# Patient Record
Sex: Male | Born: 1965 | Hispanic: No | Marital: Married | Smoking: Never smoker
Health system: Southern US, Community
[De-identification: ages and names within clinical notes are randomized; demographics above are authoritative.]

## PROBLEM LIST (undated history)

## (undated) DIAGNOSIS — Z9889 Other specified postprocedural states: Secondary | ICD-10-CM

## (undated) DIAGNOSIS — K219 Gastro-esophageal reflux disease without esophagitis: Secondary | ICD-10-CM

## (undated) DIAGNOSIS — R112 Nausea with vomiting, unspecified: Secondary | ICD-10-CM

## (undated) DIAGNOSIS — G4730 Sleep apnea, unspecified: Secondary | ICD-10-CM

## (undated) HISTORY — DX: Other specified postprocedural states: Z98.890

## (undated) HISTORY — PX: APPENDECTOMY: SHX54

## (undated) HISTORY — DX: Sleep apnea, unspecified: G47.30

## (undated) HISTORY — DX: Gastro-esophageal reflux disease without esophagitis: K21.9

## (undated) HISTORY — DX: Nausea with vomiting, unspecified: R11.2

## (undated) HISTORY — PX: COLONOSCOPY: SHX174

---

## 2005-02-27 ENCOUNTER — Encounter: Admission: RE | Admit: 2005-02-27 | Discharge: 2005-02-27 | Payer: Self-pay | Admitting: Internal Medicine

## 2005-12-20 ENCOUNTER — Emergency Department (HOSPITAL_COMMUNITY): Admission: EM | Admit: 2005-12-20 | Discharge: 2005-12-20 | Payer: Self-pay | Admitting: Family Medicine

## 2008-07-13 ENCOUNTER — Ambulatory Visit: Payer: Self-pay | Admitting: Internal Medicine

## 2008-07-13 DIAGNOSIS — N401 Enlarged prostate with lower urinary tract symptoms: Secondary | ICD-10-CM

## 2008-07-13 LAB — CONVERTED CEMR LAB
Cholesterol: 141 mg/dL (ref 0–200)
HDL: 41.8 mg/dL (ref >39.00)
LDL Cholesterol: 86 mg/dL (ref 0–99)
Total CHOL/HDL Ratio: 3

## 2009-03-06 ENCOUNTER — Ambulatory Visit: Payer: Self-pay | Admitting: Internal Medicine

## 2009-03-06 DIAGNOSIS — N453 Epididymo-orchitis: Secondary | ICD-10-CM | POA: Insufficient documentation

## 2009-03-06 DIAGNOSIS — N508 Other specified disorders of male genital organs: Secondary | ICD-10-CM | POA: Insufficient documentation

## 2009-03-08 ENCOUNTER — Encounter: Admission: RE | Admit: 2009-03-08 | Discharge: 2009-03-08 | Payer: Self-pay | Admitting: Internal Medicine

## 2009-03-08 ENCOUNTER — Encounter: Payer: Self-pay | Admitting: Internal Medicine

## 2009-03-10 ENCOUNTER — Telehealth: Payer: Self-pay | Admitting: Internal Medicine

## 2010-03-06 NOTE — Letter (Signed)
Summary: Results Follow-up Letter  [facility]  [address] [place], [place] [address]   Phone: [phone]  Fax: [phone]    03/08/2009  [address] [place], [place]  [address]  Dear Mr. HOLZHAUER,   The following are the results of your recent test(s):  Test     Result     Ultrasound     small benign fluid collections, essentially normal   _________________________________________________________  Please call for an appointment as needed _________________________________________________________ _________________________________________________________ _________________________________________________________  Sincerely,  Sanda Linger MD [facility]

## 2010-03-06 NOTE — Assessment & Plan Note (Signed)
Summary: MALE PROBLEMS PER WIFE/NWS   Vital Signs:  Patient profile:   45 year old male Height:      154 inches Weight:      135 pounds BMI:     4.02 O2 Sat:      98 % on Room air Temp:     97.9 degrees F oral Pulse rate:   74 / minute Pulse rhythm:   regular Resp:     16 per minute BP sitting:   130 / 80  (left arm) Cuff size:   large  Vitals Entered By: Rock Nephew CMA (March 06, 2009 8:18 AM)  O2 Flow:  Room air  Primary Care Provider:  Etta Grandchild MD   History of Present Illness: He returns c/o one week hx. of pain and swelling in right scrotum. It has slowly eased over the last 2 days.  Preventive Screening-Counseling & Management  Alcohol-Tobacco     Alcohol drinks/day: 0     Smoking Status: never  Medications Prior to Update: 1)  None  Current Medications (verified): 1)  None  Allergies (verified): No Known Drug Allergies  Past History:  Past Medical History: Reviewed history from 07/13/2008 and no changes required. Hemorrhoids  Past Surgical History: Reviewed history from 07/13/2008 and no changes required. Appendectomy  Family History: Reviewed history from 07/13/2008 and no changes required. negative  Social History: Reviewed history from 07/13/2008 and no changes required. Occupation: Nutrition Prof. at [employer] Married Never Smoked Alcohol use-no Drug use-no Regular exercise-yes  Review of Systems  The patient denies anorexia, fever, abdominal pain, genital sores, suspicious skin lesions, and enlarged lymph nodes.   GU:  Denies discharge, dysuria, genital sores, hematuria, incontinence, nocturia, urinary frequency, and urinary hesitancy.  Physical Exam  General:  alert, well-developed, well-nourished, well-hydrated, appropriate dress, normal appearance, healthy-appearing, cooperative to examination, and good hygiene.   Neck:  No deformities, masses, or tenderness noted.normal carotid upstroke and no carotid bruits.   Lungs:   Normal respiratory effort, chest expands symmetrically. Lungs are clear to auscultation, no crackles or wheezes. Heart:  Normal rate and regular rhythm. S1 and S2 normal without gallop, murmur, click, rub or other extra sounds. Abdomen:  soft, non-tender, normal bowel sounds, no distention, no masses, no guarding, no rigidity, no rebound tenderness, no abdominal hernia, no inguinal hernia, no hepatomegaly, no splenomegaly, and abdominal scar(s).   Genitalia:  in the right scrotum there is a hard, 1 cm mass in the epididymitis with some ttp but no fluctuance.  circumcised, no hydrocele, no varicocele, no testicular masses or atrophy, no cutaneous lesions, and no urethral discharge.   Skin:  turgor normal, color normal, no rashes, no suspicious lesions, no ecchymoses, no petechiae, no purpura, and no ulcerations.   Inguinal Nodes:  no R inguinal adenopathy and no L inguinal adenopathy.   Psych:  Cognition and judgment appear intact. Alert and cooperative with normal attention span and concentration. No apparent delusions, illusions, hallucinations   Impression & Recommendations:  Problem # 1:  EPIDIDYMITIS, RIGHT (ICD-604.90) Assessment New start doxycycline, wear supportive underwear, try some nsaids  Problem # 2:  SCROTAL MASS (ICD-608.89) Assessment: New  Orders: Radiology Referral (Radiology)  Complete Medication List: 1)  Doxycycline Hyclate 100 Mg Tabs (Doxycycline hyclate) .... One by mouth  two times a day for 14 days  Patient Instructions: 1)  Please schedule a follow-up appointment in 2 weeks. 2)  Take your antibiotic as prescribed until ALL of it is gone, but stop if  you develop a rash or swelling and contact our office as soon as possible. 3)  Take 400-600mg  of Ibuprofen (Advil, Motrin) with food every 4-6 hours as needed for relief of pain or comfort of fever. Prescriptions: DOXYCYCLINE HYCLATE 100 MG TABS (DOXYCYCLINE HYCLATE) One by mouth  two times a day for 14 days  #28 x  1   Entered and Authorized by:   Etta Grandchild MD   Signed by:   Etta Grandchild MD on 03/06/2009   Method used:   Print then Give to Patient   RxID:   [id number]

## 2010-03-06 NOTE — Progress Notes (Signed)
Summary: u/s results  Phone Note Call from Patient Call back at [facility] Phone [phone]   Summary of Call: Patient called for u/s results. Initial call taken by: Lucious Groves,  March 10, 2009 9:16 AM  Follow-up for Phone Call        left message on machine to call back to office. Follow-up by: Lucious Groves,  March 10, 2009 9:33 AM  Additional Follow-up for Phone Call Additional follow up Details #1::        Patient notified, and notes that he is not bothered much anymore. He will call as needed. Additional Follow-up by: Lucious Groves,  March 10, 2009 10:28 AM

## 2010-09-21 ENCOUNTER — Encounter: Payer: Self-pay | Admitting: Internal Medicine

## 2010-09-21 ENCOUNTER — Ambulatory Visit (INDEPENDENT_AMBULATORY_CARE_PROVIDER_SITE_OTHER): Payer: Self-pay | Admitting: Internal Medicine

## 2010-09-21 VITALS — BP 130/96 | HR 72 | Temp 97.6°F | Resp 16 | Ht 66.0 in | Wt 167.0 lb

## 2010-09-21 DIAGNOSIS — N508 Other specified disorders of male genital organs: Secondary | ICD-10-CM

## 2010-09-21 DIAGNOSIS — N451 Epididymitis: Secondary | ICD-10-CM | POA: Insufficient documentation

## 2010-09-21 DIAGNOSIS — N5089 Other specified disorders of the male genital organs: Secondary | ICD-10-CM | POA: Insufficient documentation

## 2010-09-21 DIAGNOSIS — N453 Epididymo-orchitis: Secondary | ICD-10-CM

## 2010-09-21 MED ORDER — DOXYCYCLINE HYCLATE 100 MG PO TBEC: 100.0000 mg | DELAYED_RELEASE_TABLET | Freq: Two times a day (BID) | ORAL | Status: AC

## 2010-09-21 NOTE — Progress Notes (Signed)
  Subjective:    Patient ID: Wyatt Joseph, male    DOB: [birth year], 45 y.o.   MRN: [id number]  HPI He returns with a complaint that the nodule in his right scrotum has become painful and swollen over the last few weeks. He had an u/s done over a year ago that showed it is a hydrocele - he remains concerned about it.    Review of Systems  Constitutional: Negative.   HENT: Negative.   Eyes: Negative.   Respiratory: Negative.   Cardiovascular: Negative.   Gastrointestinal: Negative.   Genitourinary: Positive for scrotal swelling and testicular pain. Negative for dysuria, urgency, frequency, hematuria, flank pain, decreased urine volume, discharge, penile swelling, enuresis, difficulty urinating, genital sores and penile pain.  Musculoskeletal: Negative.   Skin: Negative for color change, pallor, rash and wound.  Neurological: Negative for dizziness, tremors, seizures, syncope, facial asymmetry, speech difficulty, weakness, light-headedness, numbness and headaches.  Hematological: Negative for adenopathy. Does not bruise/bleed easily.  Psychiatric/Behavioral: Negative.        Objective:   Physical Exam  Vitals reviewed. Constitutional: He is oriented to person, place, and time. He appears well-developed and well-nourished. No distress.  HENT:  Head: Normocephalic and atraumatic.  Right Ear: External ear normal.  Left Ear: External ear normal.  Nose: Nose normal.  Mouth/Throat: Oropharynx is clear and moist. No oropharyngeal exudate.  Eyes: Conjunctivae and EOM are normal. Pupils are equal, round, and reactive to light. Right eye exhibits no discharge. Left eye exhibits no discharge. No scleral icterus.  Neck: Normal range of motion. Neck supple. No JVD present. No tracheal deviation present. No thyromegaly present.  Cardiovascular: Normal rate, regular rhythm, normal heart sounds and intact distal pulses.  Exam reveals no gallop and no friction rub.   No murmur  heard. Pulmonary/Chest: Effort normal and breath sounds normal. No stridor. No respiratory distress. He has no wheezes. He has no rales. He exhibits no tenderness.  Abdominal: Soft. Bowel sounds are normal. He exhibits no distension and no mass. There is no tenderness. There is no rebound and no guarding. Hernia confirmed negative in the right inguinal area and confirmed negative in the left inguinal area.  Genitourinary: Penis normal. Right testis shows mass (there is firm, discreet nodlue under the right testicle). Right testis shows no swelling and no tenderness. Right testis is descended. Cremasteric reflex is not absent on the right side. Left testis shows no mass, no swelling and no tenderness. Left testis is descended. Cremasteric reflex is not absent on the left side. Circumcised. No penile erythema or penile tenderness. No discharge found.  Musculoskeletal: Normal range of motion. He exhibits no edema and no tenderness.  Lymphadenopathy:    He has no cervical adenopathy.       Right: No inguinal adenopathy present.       Left: No inguinal adenopathy present.  Neurological: He is alert and oriented to person, place, and time. He has normal reflexes. He displays normal reflexes. No cranial nerve deficit. He exhibits normal muscle tone. Coordination normal.  Skin: Skin is warm and dry. No rash noted. He is not diaphoretic. No erythema. No pallor.  Psychiatric: He has a normal mood and affect. His behavior is normal. Judgment and thought content normal.          Assessment & Plan:

## 2010-09-21 NOTE — Assessment & Plan Note (Signed)
I have told him that this is a benign cyst but he remains concerned so I have asked him to see a urologist for a second opinion

## 2010-09-21 NOTE — Assessment & Plan Note (Signed)
Try a course of doxy to see if that helps

## 2010-09-21 NOTE — Patient Instructions (Signed)
Epididymitis Epididymitis is a swelling (inflammation) of the epididymis. The epididymis is a cord-like structure along the back part of the testicle. Epididymitis is usually, but not always, caused by infection. This is usually a sudden problem beginning with chills, fever and pain behind the scrotum and in the testicle. There may be swelling and redness of the testicle. DIAGNOSIS Physical examination will reveal a tender, swollen epididymis. Sometimes, cultures are obtained from the urine or from prostate secretions to help find out if there is an infection or if the cause is a different problem. Sometimes, blood work is performed to see if your white blood cell count is elevated and if a germ (bacterial) or viral infection is present. Using this knowledge, an appropriate medication which kills germs (antibiotic) can be chosen by your caregiver. A viral infection causing epididymitis will most often go away (resolve) without treatment. HOME CARE INSTRUCTIONS  Hot sitz baths for 20 minutes, 4 times per day, may help relieve pain.   Only take over-the-counter or prescription medicines for pain, discomfort or fever as directed by your caregiver.   Take all medications, including antibiotics, as directed. Take the antibiotics for the full prescribed length of time even if you are feeling better.   It is very important to keep all follow-up appointments.  SEEK IMMEDIATE MEDICAL CARE IF:  An oral temperature above 100.5 develops, not controlled by medication.   You have pain not relieved with medications.   You have any worsening of your problems (symptoms).   The pain seems to come and go.   You develop pain, redness and swelling in the scrotum and surrounding areas.  MAKE SURE YOU:  Understand these instructions.   Will watch your condition.   Will get help right away if you are not doing well or get worse.  Document Released: 01/19/2000 Document Re-Released: 04/17/2009 [facility]  Patient Information 2011 [place], [place].

## 2017-06-02 ENCOUNTER — Ambulatory Visit: Payer: BC Managed Care – PPO | Admitting: Family Medicine

## 2017-06-02 ENCOUNTER — Encounter: Payer: Self-pay | Admitting: Family Medicine

## 2017-06-02 VITALS — BP 112/78 | HR 59 | Temp 98.0°F | Resp 12 | Ht 67.0 in | Wt 165.0 lb

## 2017-06-02 DIAGNOSIS — R001 Bradycardia, unspecified: Secondary | ICD-10-CM

## 2017-06-02 DIAGNOSIS — G4730 Sleep apnea, unspecified: Secondary | ICD-10-CM | POA: Diagnosis not present

## 2017-06-02 DIAGNOSIS — Z0001 Encounter for general adult medical examination with abnormal findings: Secondary | ICD-10-CM

## 2017-06-02 DIAGNOSIS — M25511 Pain in right shoulder: Secondary | ICD-10-CM | POA: Diagnosis not present

## 2017-06-02 DIAGNOSIS — G8929 Other chronic pain: Secondary | ICD-10-CM | POA: Diagnosis not present

## 2017-06-02 DIAGNOSIS — Z13220 Encounter for screening for lipoid disorders: Secondary | ICD-10-CM | POA: Diagnosis not present

## 2017-06-02 LAB — COMPREHENSIVE METABOLIC PANEL
ALT: 15 U/L (ref 0–53)
AST: 18 U/L (ref 0–37)
Albumin: 4.6 g/dL (ref 3.5–5.2)
Alkaline Phosphatase: 82 U/L (ref 39–117)
BUN: 11 mg/dL (ref 6–23)
CO2: 30 meq/L (ref 19–32)
Calcium: 9.7 mg/dL (ref 8.4–10.5)
Chloride: 103 mEq/L (ref 96–112)
Creatinine, Ser: 0.84 mg/dL (ref 0.40–1.50)
GFR: 101.65 mL/min (ref >60.00)
GLUCOSE: 89 mg/dL (ref 70–99)
POTASSIUM: 4.2 meq/L (ref 3.5–5.1)
Sodium: 140 mEq/L (ref 135–145)
Total Bilirubin: 1.5 mg/dL — ABNORMAL HIGH (ref 0.2–1.2)
Total Protein: 7 g/dL (ref 6.0–8.3)

## 2017-06-02 LAB — CBC
HEMATOCRIT: 50.5 % (ref 39.0–52.0)
HEMOGLOBIN: 17.1 g/dL — AB (ref 13.0–17.0)
MCHC: 33.9 g/dL (ref 30.0–36.0)
MCV: 87.3 fl (ref 78.0–100.0)
PLATELETS: 204 10*3/uL (ref 150.0–400.0)
RBC: 5.78 Mil/uL (ref 4.22–5.81)
RDW: 12.1 % (ref 11.5–15.5)
WBC: 3.6 10*3/uL — AB (ref 4.0–10.5)

## 2017-06-02 LAB — LIPID PANEL
CHOL/HDL RATIO: 3
Cholesterol: 146 mg/dL (ref 0–200)
HDL: 47.4 mg/dL (ref >39.00)
LDL CALC: 84 mg/dL (ref 0–99)
NONHDL: 98.81
Triglycerides: 73 mg/dL (ref 0.0–149.0)
VLDL: 14.6 mg/dL (ref 0.0–40.0)

## 2017-06-02 LAB — TSH: TSH: 0.77 u[IU]/mL (ref 0.35–4.50)

## 2017-06-02 NOTE — Progress Notes (Signed)
Subjective:  Wyatt Joseph is a 52 y.o. male who presents today for his annual comprehensive physical exam and to establish care.   HPI:  He has no acute complaints today.   He has 2 stable, chronic conditions outlined below: 1.  Right shoulder pain.  Several year history.  No obvious precipitating events.  No treatments tried.  Symptoms seem to be worse at night. 2.  Sleep apnea symptoms.  Had a sleep study done in the past which showed several apneic episodes.  Interested in having sleep study done again.  Lifestyle Diet: Tries to eat a healthy and balanced diet.  Exercise: Walks regularly. Occasionally goes to the gym.   Depression screen PHQ 2/9 06/02/2017  Decreased Interest 0  Down, Depressed, Hopeless 0  PHQ - 2 Score 0   Health Maintenance Due  Topic Date Due  . HIV Screening  07/21/1979  . TETANUS/TDAP  02/04/2014  . COLONOSCOPY  07/21/2014    ROS: Per HPI, otherwise a complete review of systems was negative.   PMH:  The following were reviewed and entered/updated in epic: Past Medical History:  Diagnosis Date  . Hemorrhoids    Patient Active Problem List   Diagnosis Date Noted  . Scrotal mass 09/21/2010  . Epididymitis, right 09/21/2010  . BENIGN PROSTATIC HYPERTROPHY, WITH URINARY OBSTRUCTION 07/13/2008   Past Surgical History:  Procedure Laterality Date  . APPENDECTOMY      Family History  Problem Relation Age of Onset  . Cancer Neg Hx     Medications- reviewed and updated No current outpatient medications on file.   No current facility-administered medications for this visit.     Allergies-reviewed and updated No Known Allergies  Social History   Socioeconomic History  . Marital status: Married    Spouse name: Not on file  . Number of children: 2  . Years of education: Not on file  . Highest education level: Not on file  Occupational History  . Not on file  Social Needs  . Financial resource strain: Not on file  . Food  insecurity:    Worry: Not on file    Inability: Not on file  . Transportation needs:    Medical: Not on file    Non-medical: Not on file  Tobacco Use  . Smoking status: Never Smoker  . Smokeless tobacco: Never Used  . Tobacco comment: Regular Exercise - Yes  Substance and Sexual Activity  . Alcohol use: No  . Drug use: No  . Sexual activity: Yes    Birth control/protection: Condom  Lifestyle  . Physical activity:    Days per week: Not on file    Minutes per session: Not on file  . Stress: Not on file  Relationships  . Social connections:    Talks on phone: Not on file    Gets together: Not on file    Attends religious service: Not on file    Active member of club or organization: Not on file    Attends meetings of clubs or organizations: Not on file    Relationship status: Not on file  Other Topics Concern  . Not on file  Social History Narrative  . Not on file    Objective:  Physical Exam: BP 112/78 (BP Location: Left Arm)   Pulse (!) 59   Temp 98 F (36.7 C) (Oral)   Resp 12   Ht  (1.702 m)   Wt 165 lb (74.8 kg)   SpO2 99%  BMI 25.84 kg/m   Body mass index is 25.84 kg/m. Wt Readings from Last 3 Encounters:  06/02/17 165 lb (74.8 kg)  09/21/10 167 lb (75.8 kg)   Gen: NAD, resting comfortably HEENT: TMs normal bilaterally. OP clear. No thyromegaly noted.  CV: RRR with no murmurs appreciated Pulm: NWOB, CTAB with no crackles, wheezes, or rhonchi GI: Normal bowel sounds present. Soft, Nontender, Nondistended. MSK:  -Right neck: No deformities.  Spurling negative. -Right shoulder: No deformities.  Strength 5 out of 5 throughout.  No pain with internal or external rotation.  Hawking and Neer test positive. Skin: warm, dry Neuro: CN2-12 grossly intact. Strength 5/5 in upper and lower extremities. Reflexes symmetric and intact bilaterally.  Psych: Normal affect and thought content  Assessment/Plan:  Right shoulder pain Exam consistent with rotator  cuff impingement.  Discussed conservative management including home exercise program.  Patient not interested in oral medications at this point.  Sleep disordered breathing Referral to sleep medicine placed.  Bradycardia Asymptomatic.  Check CBC, TSH, and CMET.  Preventative Healthcare: Check lipid panel.  Obtain records from previous PCP regarding colon cancer screening.  Patient Counseling(The following topics were reviewed and/or handout was given):  -Nutrition: Stressed importance of moderation in sodium/caffeine intake, saturated fat and cholesterol, caloric balance, sufficient intake of fresh fruits, vegetables, and fiber.  -Stressed the importance of regular exercise.   -Substance Abuse: Discussed cessation/primary prevention of tobacco, alcohol, or other drug use; driving or other dangerous activities under the influence; availability of treatment for abuse.   -Injury prevention: Discussed safety belts, safety helmets, smoke detector, smoking near bedding or upholstery.   -Sexuality: Discussed sexually transmitted diseases, partner selection, use of condoms, avoidance of unintended pregnancy and contraceptive alternatives.   -Dental health: Discussed importance of regular tooth brushing, flossing, and dental visits.  -Health maintenance and immunizations reviewed. Please refer to Health maintenance section.  Return to care in 1 year for next preventative visit.   Katina Degree. [name], MD 06/02/2017 8:36 AM

## 2017-06-02 NOTE — Patient Instructions (Signed)

## 2017-06-04 ENCOUNTER — Other Ambulatory Visit: Payer: Self-pay

## 2017-06-04 DIAGNOSIS — R17 Unspecified jaundice: Secondary | ICD-10-CM

## 2017-06-05 ENCOUNTER — Other Ambulatory Visit (INDEPENDENT_AMBULATORY_CARE_PROVIDER_SITE_OTHER): Payer: BC Managed Care – PPO

## 2017-06-05 DIAGNOSIS — R17 Unspecified jaundice: Secondary | ICD-10-CM | POA: Diagnosis not present

## 2017-06-06 LAB — BILIRUBIN, FRACTIONATED(TOT/DIR/INDIR)
Bilirubin, Direct: 0.3 mg/dL — ABNORMAL HIGH (ref 0.0–0.2)
Indirect Bilirubin: 0.8 mg/dL (calc) (ref 0.2–1.2)
Total Bilirubin: 1.1 mg/dL (ref 0.2–1.2)

## 2017-07-21 ENCOUNTER — Ambulatory Visit: Payer: BC Managed Care – PPO | Admitting: Neurology

## 2017-07-21 ENCOUNTER — Encounter: Payer: Self-pay | Admitting: Neurology

## 2017-07-21 VITALS — BP 124/86 | HR 56 | Ht 66.0 in | Wt 166.0 lb

## 2017-07-21 DIAGNOSIS — G4733 Obstructive sleep apnea (adult) (pediatric): Secondary | ICD-10-CM | POA: Diagnosis not present

## 2017-07-21 DIAGNOSIS — R0683 Snoring: Secondary | ICD-10-CM

## 2017-07-21 NOTE — Progress Notes (Signed)
SLEEP MEDICINE CLINIC   Provider:  Melvyn Novasarmen  Kenitra Leventhal, [place]  Primary Care Physician:  Ardith DarkParker, Caleb M, MD   Referring Provider: Ardith DarkParker, Caleb M, MD    Chief Complaint  Patient presents with  . New Patient (Initial Visit)    pt alone, rm 11. pt states that he snores in sleep. pt states that he gets 6-7 hours each night of sleep and then sometimes he feels that he isnt well rested. states that he wakes up with dry mouth. pt [place], unable to get study, stated he was mild to moderate sleep apnea. he tried a dental device.     HPI:  Wyatt MootsMohamed Joseph is a 52 y.o. male , seen here as a NP referral from Dr. Jimmey RalphParker for a sleep apnea evaluation.   Wyatt Joseph wakes up with a dry mouth, snores ( wife reported this ) and he has been not resting well. He suspects he has apnea. He rests well, but is not refreshed as he used to be.   2 years ago by the patient lived in [place] he underwent a home sleep test through a physician, he recalls that he was diagnosed with mild to moderate apnea, he did not want to go to a CPAP route and instead chose a dental device.  He said that the dental device was made for him and is a device that is potentially adjustable, however it led to toothache as his teeth began to shift under the dental therapy.  He would now allow for a CPAP, if it is a  travel friendly device.      Chief complaint according to patient :  Sleep habits are as follows: he allocate at least 6 hours.  Bedtime is 11 - midnight, rise time is 6 AM, on weekends a little later. No electronics in the bedroom.  He works on the computer before he retreats to the bedroom.  Wyatt Joseph describes his bedroom is cool, quiet and dark.  He shares a bedroom with his wife who has witnessed him to snore, but she has not reported him to stop in his breathing. He has sometimes woken up from his snoring. He sleeps mostly on the side, but he finds himself turning to his back.  He  sleeps on 2 pillows, one for the shoulder and one for head and neck.  He has right shoulder pain.  He rarely has nocturia and dreams some night.   Sleep medical history and family sleep history:  No sleep walking history , snoring started 2 years ago , father had apnea and snoring.    Social history:   Married , from [place]. Non smoker, non ETOH, caffeine : tea, 2 a day, Sodas not daily. Coffee less frequent.  He travels for business, sometimes against time zone. Not a shift worker.     Review of Systems: Out of a complete 14 system review, the patient complains of only the following symptoms, and all other reviewed systems are negative.   Epworth score 5 , Fatigue severity score 27 , depression score N/a    Social History   Socioeconomic History  . Marital status: Married    Spouse name: Not on file  . Number of children: 2  . Years of education: Not on file  . Highest education level: Not on file  Occupational History  . Not on file  Social Needs  . Financial resource strain: Not on file  .  Food insecurity:    Worry: Not on file    Inability: Not on file  . Transportation needs:    Medical: Not on file    Non-medical: Not on file  Tobacco Use  . Smoking status: Never Smoker  . Smokeless tobacco: Never Used  . Tobacco comment: Regular Exercise - Yes  Substance and Sexual Activity  . Alcohol use: No  . Drug use: No  . Sexual activity: Yes    Birth control/protection: Condom  Lifestyle  . Physical activity:    Days per week: Not on file    Minutes per session: Not on file  . Stress: Not on file  Relationships  . Social connections:    Talks on phone: Not on file    Gets together: Not on file    Attends religious service: Not on file    Active member of club or organization: Not on file    Attends meetings of clubs or organizations: Not on file    Relationship status: Not on file  . Intimate partner violence:    Fear of current or ex partner: Not on file     Emotionally abused: Not on file    Physically abused: Not on file    Forced sexual activity: Not on file  Other Topics Concern  . Not on file  Social History Narrative  . Not on file    Family History  Problem Relation Age of Onset  . Cancer Neg Hx     Past Medical History:  Diagnosis Date  . Hemorrhoids     Past Surgical History:  Procedure Laterality Date  . APPENDECTOMY      No current outpatient medications on file.   No current facility-administered medications for this visit.     Allergies as of 07/21/2017  . (No Known Allergies)    Vitals: BP 124/86   Pulse (!) 56   Ht 5\' 6"  (1.676 m)   Wt 166 lb (75.3 kg)   BMI 26.79 kg/m  Last Weight:  Wt Readings from Last 1 Encounters:  07/21/17 166 lb (75.3 kg)   QIO:NGEX mass index is 26.79 kg/m.     Last Height:   Ht Readings from Last 1 Encounters:  07/21/17 5\' 6"  (1.676 m)    Physical exam:  General: The patient is awake, alert and appears not in acute distress. The patient is well groomed. Head: Normocephalic, atraumatic. Neck is supple. Mallampati 3 - lateral narrow, but not tight. , tonsils in place.  neck circumference: 41 cm , 15.75 " Nasal airflow patent ,  Retrognathia is mild .  Cardiovascular:  Regular rate and rhythm, without  murmurs or carotid bruit, and without distended neck veins. Respiratory: Lungs are clear to auscultation. Skin:  Without evidence of edema, or rash Trunk: BMI is 26.7 . The patient's posture is erect.  Neurologic exam : The patient is awake and alert, oriented to place and time.   Attention span & concentration ability appears normal.  Speech is fluent,  without dysarthria, dysphonia or aphasia.  Mood and affect are appropriate.  Cranial nerves: Pupils are equal and briskly reactive to light.  Funduscopic exam without evidence of pallor or edema. Extraocular movements  in vertical and horizontal planes intact and without nystagmus. Visual fields by finger perimetry are  intact. Hearing to finger rub intact.   Facial sensation intact to fine touch.  Facial motor strength is symmetric and tongue and uvula move midline. Shoulder shrug was symmetrical.   Motor  exam:  Normal tone, muscle bulk and symmetric strength in all extremities. Sensory:  Fine touch, pinprick and vibration were tested in all extremities.  Coordination: Rapid alternating movements in the fingers/hands was normal. Finger-to-nose maneuver  normal without evidence of ataxia, dysmetria or tremor. Gait and station: Patient walks without assistive device and is able unassisted to climb up to the exam table.   Deep tendon reflexes: in the  upper and lower extremities are symmetric and intact.    Assessment:  After physical and neurologic examination, review of laboratory studies,  Personal review of imaging studies, reports of other /same  Imaging studies, results of polysomnography and / or neurophysiology testing and pre-existing records as far as provided in visit., my assessment is   1)  Likely snoring with mild OSA, I think a HST is sufficient.   2)  Not suffering from  hypersomnia, but yawning frequently.    3) fatigue 27 points-  some times.    The patient was advised of the nature of the diagnosed disorder , the treatment options and the  risks for general health and wellness arising from not treating the condition.  I will order a HST for apnea screening.    I spent more than 40 minutes of face to face time with the patient.  Greater than 50% of time was spent in counseling and coordination of care. We have discussed the diagnosis and differential and I answered the patient's questions.    Plan:  Treatment plan and additional workup :  HST -  Screening  Test.   Rv after test. He would now allow for a CPAP, if it is a  travel friendly device.    Melvyn Novas, MD 07/21/2017, 12:01 PM  Certified in Neurology by ABPN Certified in Sleep Medicine by [facility], [facility] [address], [address] [place], [place] [address]

## 2017-07-21 NOTE — Patient Instructions (Signed)
Apnea treatment options include CPAP -BiPAP and VPAP,  dental device, and INSPIRE procedure.

## 2017-08-04 ENCOUNTER — Encounter: Payer: Self-pay | Admitting: Sports Medicine

## 2017-08-04 ENCOUNTER — Ambulatory Visit
Admission: RE | Admit: 2017-08-04 | Discharge: 2017-08-04 | Disposition: A | Discharge status: Home / Self Care | Payer: BC Managed Care – PPO | Source: Ambulatory Visit | Attending: Sports Medicine | Admitting: Sports Medicine

## 2017-08-04 ENCOUNTER — Ambulatory Visit: Payer: BC Managed Care – PPO | Admitting: Sports Medicine

## 2017-08-04 VITALS — BP 110/70 | Ht 66.0 in | Wt 160.0 lb

## 2017-08-04 DIAGNOSIS — M25561 Pain in right knee: Secondary | ICD-10-CM

## 2017-08-04 DIAGNOSIS — G8929 Other chronic pain: Secondary | ICD-10-CM

## 2017-08-04 DIAGNOSIS — M25511 Pain in right shoulder: Secondary | ICD-10-CM | POA: Diagnosis not present

## 2017-08-04 DIAGNOSIS — M25562 Pain in left knee: Secondary | ICD-10-CM | POA: Diagnosis not present

## 2017-08-04 NOTE — Progress Notes (Signed)
   Subjective:    Patient ID: Wyatt Joseph, male    DOB: 21-Jul-1965, 52 y.o.   MRN: [id number]  HPI Patient is a 52 year old male who presents today complaining of right shoulder pain.  Patient reports that pain has been going on for the past year and a half.  Reports pain is worse in the morning after waking up.  He describes the pain as sharp inside the shoulder joint.  He is not bothered by it during the day except when he does specific movements.  Patient has not tried any medications.  Patient also denies any trauma or prior injury to his right shoulder.  Patient was seen by PCP and was diagnosed with rotator cuff impingement.  Patient was given exercises to do at home but he reports nonadherence to regimen.  Patient is slightly concerned about tenderness in his right shoulder since it appears to be chronic in nature.  Patient states he [facility] at [employer].  Review of Systems  Constitutional: Negative.   HENT: Negative.   Eyes: Negative.   Respiratory: Negative.   Cardiovascular: Negative.   Gastrointestinal: Negative.   Endocrine: Negative.   Genitourinary: Negative.   Musculoskeletal:       Right shoulder pain/tenderness  Skin: Negative.   Neurological: Negative.   Hematological: Negative.   Psychiatric/Behavioral: Negative.        Objective:   Physical Exam Right shoulder: Inspection reveals no abnormalities, atrophy or asymmetry.  Palpation is normal with no tenderness over AC joint or bicipital groove.  ROM is full in all planes.  Rotator cuff strength normal throughout.  Positive Neer and Hawkin's tests, empty can test within normal limit.  Speeds and Yergason's tests normal.  Passive external rotation with pain noted around 60 degree No labral pathology noted with negative Obrien's, negative clunk and good stability.  Normal scapular function observed.  Painful arc and no drop arm sign.  No apprehension sign      Assessment & Plan:   #Right shoulder  pain, chronic, worsening Patient presents with right shoulder pain for over a year. Diagnosed with right rotator cuff impingement by PCP and given home exercise that patient has not done. On exam here today patient had positive Neer and Hawkins test consistent with cuff impingement. However given chronicity and patient age would also like to rule out possible arthritis. No trauma or prior injury reported. Patient is reticent to the idea of using NSAIDs for pain and would like to try conservative management.  --Order axillary view of right shoulder --Schedule right shoulder U/S for 7/8 @10 :15 --Will discuss plan and approach at next office visit after imaging results.  Patient seen and evaluated with the resident. I agree with the above plan of care. X-rays show no significant degenerative changes. Patient will follow-up for right shoulder ultrasound on July 8. We will delineate further treatment based on those findings. Of note, his symptoms seem to be most noticeable first thing in the morning and really do not limit his activities during the day.

## 2017-08-04 NOTE — Addendum Note (Signed)
Addended by: Reino BellisRAPER, TIMOTHY R on: 08/04/2017 06:01 PM   Modules accepted: Level of Service

## 2017-08-11 ENCOUNTER — Ambulatory Visit (INDEPENDENT_AMBULATORY_CARE_PROVIDER_SITE_OTHER): Payer: BC Managed Care – PPO | Admitting: Sports Medicine

## 2017-08-11 VITALS — BP 118/82 | Ht 66.0 in | Wt 160.0 lb

## 2017-08-11 DIAGNOSIS — G8929 Other chronic pain: Secondary | ICD-10-CM | POA: Diagnosis not present

## 2017-08-11 DIAGNOSIS — M25511 Pain in right shoulder: Secondary | ICD-10-CM | POA: Diagnosis not present

## 2017-08-11 NOTE — Progress Notes (Signed)
  Patient comes in today for follow-up. He is here today for a shoulder ultrasound. X-rays of his right shoulder were unremarkable. Please see the office visit note from last week for details regarding history and physical exam findings.  MSK ultrasound of the right shoulder was performed. Limited images were obtained. Biceps tendon was well visualized within the bicipital groove and was unremarkable. Acromioclavicular joint also unremarkable. Subscapularis, supraspinatus, and infraspinatus tendons all well-visualized. There are some small calcifications within the midportion of the supraspinatus tendon but no discrete tear is seen. Slight fluid in the subacromial bursa. 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 Tentative follow-up for 6 weeks from now which the patient may cancel if he's feeling better. If symptoms persist or worsen, we could consider merits of a subacromial cortisone injection or further diagnostic imaging.  Total time spent with the patient was 15 minutes with greater than 50% of the time spent in face-to-face consultation performing the ultrasound and discussing treatment.

## 2017-08-14 ENCOUNTER — Ambulatory Visit: Payer: BC Managed Care – PPO | Admitting: Neurology

## 2017-08-14 DIAGNOSIS — G4733 Obstructive sleep apnea (adult) (pediatric): Secondary | ICD-10-CM | POA: Diagnosis not present

## 2017-08-14 DIAGNOSIS — R0683 Snoring: Secondary | ICD-10-CM

## 2017-08-28 ENCOUNTER — Telehealth: Payer: Self-pay | Admitting: Neurology

## 2017-08-28 NOTE — Telephone Encounter (Signed)
I will let Dr Vickey Hugerohmeier know that he is asking about his results and will call the pt as soon as I have these results.

## 2017-08-28 NOTE — Telephone Encounter (Signed)
Pt is asking for a call with results of sleep study when they are available

## 2017-08-28 NOTE — Procedures (Signed)
[facility] [address] [place], [place] [address] NAME:    [name]                                                  DOB: 04/15/1965 MEDICAL RECORD:  [id number]                                              DOS:  08/14/2017 REFERRING PHYSICIAN: Caleb Parker, M.D. STUDY PERFORMED: Home Sleep Study by watch pat.  HISTORY:  Wyatt Joseph is a 52 y.o. male patient, who wakes up with a dry mouth, snores ( wife reported this ), and he has been not resting well. He suspects he has apnea. He is not refreshed after sleeping as he used to be.  2 years ago (while the patient lived in [place]) he underwent a home sleep test through a local physician, he was diagnosed with mild to moderate apnea. He did not want to go to a CPAP route and instead chose a dental device.  He said that the dental device was made for him and is a device that's potentially adjustable, however it led to toothache as his teeth began to shift under the dental therapy. He would now allow for a CPAP, if it is a travel friendly device.  Epworth score 5, Fatigue severity score 27, BMI: 26.6   STUDY RESULTS:  Total Recording Time:  7 hours 12 minutes, by watch pat.  Total Apnea/Hypopnea Index (AHI): 12.5 /h, RDI: 18.2 /h Average Oxygen Saturation:   95 %; Lowest Oxygen Desaturation: 89 %  Total Time Oxygen Saturation Below or at 88 %:  0.0 minutes  Average Heart Rate:  48 bpm (between 40 and 80 bpm) IMPRESSION: Mild OSA with AHI 12.5/h, but moderate- severe snoring reflected in much higher RDI.  No associated hypoxemia, cardiac irregularity or other sign of physiological stress.  RECOMMENDATION: CPAP auto titration 5-12 cm water, mask of patient's choice to be fitted by DME.        I certify that I have reviewed the raw data recording prior to the issuance of this report in accordance with the standards of the American Academy of Sleep Medicine (AASM). Nazair Fortenberry, M.D.      Medical Director of [facility], accredited by the AASM. Diplomat of the ABPN and ABSM.       

## 2017-08-28 NOTE — Telephone Encounter (Signed)
[facility] [address] [place], [place] [address] NAME:    Trenda Mootshmedna Shermar                                                  DOB: 1965-07-25 MEDICAL RECORD NUMBER [id number]                                              DOS:  08/14/2017 REFERRING PHYSICIAN: Jacquiline Doealeb Parker, M.D. STUDY PERFORMED: Home Sleep Study by watch pat.  HISTORY:  Trenda MootsMohamed Yawn is a 52 y.o. male patient, who wakes up with a dry mouth, snores ( wife reported this ), and he has been not resting well. He suspects he has apnea. He is not refreshed after sleeping as he used to be.  2 years ago (while the patient lived in [place]) he underwent a home sleep test through a local physician, he was diagnosed with mild to moderate apnea. He did not want to go to a CPAP route and instead chose a dental device.  He said that the dental device was made for him and is a device that's potentially adjustable, however it led to toothache as his teeth began to shift under the dental therapy. He would now allow for a CPAP, if it is a travel friendly device.  Epworth score 5, Fatigue severity score 27, BMI: 26.6   STUDY RESULTS:  Total Recording Time:  7 hours 12 minutes, by watch pat.  Total Apnea/Hypopnea Index (AHI): 12.5 /h, RDI: 18.2 /h Average Oxygen Saturation:   95 %; Lowest Oxygen Desaturation: 89 %  Total Time Oxygen Saturation Below or at 88 %:  0.0 minutes  Average Heart Rate:  48 bpm (between 40 and 80 bpm) IMPRESSION: Mild OSA with AHI 12.5/h, but moderate- severe snoring reflected in much higher RDI.  No associated hypoxemia, cardiac irregularity or other sign of physiological stress.  RECOMMENDATION: CPAP auto titration 5-12 cm water, mask of patient's choice to be fitted by DME.        I certify that I have reviewed the raw data recording prior to the issuance of this report in accordance with the standards of the American Academy of Sleep Medicine (AASM). Melvyn Novasarmen Olamide Lahaie, M.D.      Medical Director of [facility], accredited by the AASM. Diplomat of the ABPN and ABSM.

## 2017-08-28 NOTE — Addendum Note (Signed)
Addended by: Melvyn NovasHMEIER, Jahzaria Vary on: 08/28/2017 04:50 PM   Modules accepted: Orders

## 2017-08-29 NOTE — Telephone Encounter (Signed)
Called patient to discuss sleep study results. No answer at this time. LVM for the patient to call back.   

## 2017-08-29 NOTE — Telephone Encounter (Signed)
Called the patient back. No answer. LVM once more to review.

## 2017-08-29 NOTE — Telephone Encounter (Signed)
Pt returning RN's call.

## 2017-09-01 NOTE — Telephone Encounter (Signed)
Pt returned call.  I advised pt that Dr. Vickey Hugerohmeier reviewed their sleep study results and found that pt has sleep apnea. Dr. Vickey Hugerohmeier recommends that pt starts a CPAP machine. I reviewed PAP compliance expectations with the pt. Pt is agreeable to starting a CPAP. I advised pt that an order will be sent to a DME, [facility], and [facility] will call the pt within about one week after they file with the pt's insurance. [facility] will show the pt how to use the machine, fit for masks, and troubleshoot the CPAP if needed. A follow up appt was made for insurance purposes with Dr. Vickey Hugerohmeier on October 28,2019 at 3:30 pm. Pt verbalized understanding to arrive 15 minutes early and bring their CPAP. A letter with all of this information in it will be mailed to the pt as a reminder. I verified with the pt that the address we have on file is correct. Pt verbalized understanding of results. Pt had no questions at this time but was encouraged to call back if questions arise.

## 2017-09-01 NOTE — Telephone Encounter (Signed)
Called pt this am to review sleep study results, no answer. LVM for the patient to call back.

## 2017-11-11 ENCOUNTER — Ambulatory Visit (INDEPENDENT_AMBULATORY_CARE_PROVIDER_SITE_OTHER): Payer: BC Managed Care – PPO | Admitting: Sports Medicine

## 2017-11-11 VITALS — BP 106/70 | Ht 67.0 in | Wt 160.0 lb

## 2017-11-11 DIAGNOSIS — M7502 Adhesive capsulitis of left shoulder: Secondary | ICD-10-CM

## 2017-11-11 DIAGNOSIS — M7501 Adhesive capsulitis of right shoulder: Secondary | ICD-10-CM | POA: Diagnosis not present

## 2017-11-11 NOTE — Progress Notes (Signed)
HPI  CC: Shoulder pain/decreased ROM  Patient presents for follow-up of right shoulder pain.  He additionally is noting some left shoulder pain.  The patient has had this problem chronically and most recently was seen in July 2019, at that time an ultrasound showed small calcifications within the midportion of the supraspinatus tendon with no discrete tear.  Slight fluid in the subacromial bursa, consistent with mild calcific rotator cuff tendinopathy.  Today, he reports intermittent pain with abduction of the arm at the shoulder and internal rotation of the arm at the shoulder.  He notes decreased range of motion and is unable to abduct the arm past about 100 degrees bilaterally.  Pain is worst when he wakes first thing in the morning and he feels very stiff in the morning, this improves throughout the day.  Patient reports that he is been using nitro patches on both shoulders.  He has been doing band strengthening exercises every day.  Pain has been worsening over the last month, he feels like it is slightly better today than usual.  Denies any new injuries or trauma to the shoulders.  He denies any history of shoulder surgery.  ROS: Per HPI; in addition no fever, no rash, no additional weakness, no additional numbness, no additional paresthesias, and no additional falls/injury.   Objective: BP 106/70   Ht 5\' 7"  (1.702 m)   Wt 160 lb (72.6 kg)   BMI 25.06 kg/m  Gen: NAD, well groomed, a/o x3, normal affect.  CV: Well-perfused. Warm.  Resp: Non-labored.  Neuro: Sensation intact throughout. No gross coordination deficits.  Gait: Nonpathologic posture, unremarkable stride without signs of limp or balance issues. Shoulder, right: No bony deformity, asymmetry or muscle atrophy appreciated.  No tenderness over the long head of the biceps/in the bicipital groove.  No tenderness to palpation at the [facility] joint.  Limited active range of motion to~100 degrees and abduction.  Limited passive external  rotation and abduction.  Thumb to T12 with some tenderness.  Strength 5/5 throughout. Sensation is intact.  Empty can negative.  Crossarm test negative.  Hawkins negative.  Shoulder, left: NO evidence of bony deformity, asymmetry, or muscle atrophy; No tenderness over long head of biceps (bicipital groove). No TTP at [facility] joint.   Limited active range of motion to~100 degrees and abduction. Thumb to T12 with some tenderness. Strength 5/5 throughout.  Assessment and Plan: Bilateral shoulder stiffness and pain -history and exam are most consistent with adhesive capsulitis.  Patient's shoulder pain has been very chronic over the last year and a half.  He also has some known pathology in the shoulder with previously visualized calcific tendinopathy on his last ultrasound.  Further management could potentially include MRI imaging/referral to surgery.  Patient does not seem to be at the point where he wants surgery yet. 1.  Transition from band strength exercises to stretching exercises.  Anterior and lateral wall walks, and home swings were reviewed for improvement in range of motion at today's visit 2.  Patient to call in the next 2-4 weeks if his symptoms are not improving.  In this case, would consider formal physical therapy with treatment modalities to help improve range of motion.  Other considerations would be a subacromial injection for pain to help allow him to complete his rehab exercises.  Finally, he can consider referral for surgery if conservative measures do not alleviate symptoms enough for him to be functional. . Howard Pouch, MD PGY-3 [facility]  Patient  seen and evaluated with the resident.  I agree with the above plan of care.  Patient has clinical exam findings today consistent with adhesive capsulitis of both shoulders, right greater than left.  Previous ultrasound showed some calcific rotator cuff tendinopathy but I think his complaint of stiffness and limited  range of motion is due to his adhesive capsulitis more than his calcific tendinopathy.  For that reason, I will have him discontinue his Jobe exercises and concentrate strictly on range of motion with wall walks and pendulum swings.  Follow-up with me in 4 weeks for reevaluation.  I did discuss merits of formal physical therapy or referral to orthopedics.  I also discussed intra-articular cortisone injection but he really has very little in the way of pain.  He is encouraged to call me with questions or concerns prior to his 4-week follow-up visit.

## 2017-11-11 NOTE — Patient Instructions (Signed)
It was a pleasure seeing you today in our clinic.  Here is the treatment plan we have discussed and agreed upon together:  - Please stop doing band/strengthening exercises for now - start doing stretching exercises including wall-walks (to the front and to the side) and pendulum swings - If your symptoms are NOT improving, please call our office back so that we can consider additional therapeutic options including joint injections or formal physical therapy.  Please call with questions or concerns about what we discussed today.

## 2017-12-01 ENCOUNTER — Ambulatory Visit: Payer: BC Managed Care – PPO | Admitting: Neurology

## 2017-12-01 ENCOUNTER — Encounter: Payer: Self-pay | Admitting: Neurology

## 2017-12-01 VITALS — BP 103/69 | HR 66 | Ht 67.0 in | Wt 174.0 lb

## 2017-12-01 DIAGNOSIS — G4733 Obstructive sleep apnea (adult) (pediatric): Secondary | ICD-10-CM

## 2017-12-01 DIAGNOSIS — Z789 Other specified health status: Secondary | ICD-10-CM | POA: Insufficient documentation

## 2017-12-01 DIAGNOSIS — F40240 Claustrophobia: Secondary | ICD-10-CM

## 2017-12-01 NOTE — Progress Notes (Signed)
SLEEP MEDICINE CLINIC   Provider:  Melvyn Novas, [place]  Primary Care Physician:  Ardith Dark, MD   Referring Provider: Ardith Dark, MD    Chief Complaint  Patient presents with  . Follow-up    pt alone, rm 11. pt received CPAP machin 09/09/17 and turned the machine back in on 10/01/17. pt states unable to use the machine he developed sinus infection while using it. didnt feel that he would be able to continue using the machine.     HPI:  Wyatt Joseph is a 52 y.o. male , seen in a RV on 12-01-2017 .   I have the pleasure of meeting today with Mr. [name], who underwent a home sleep test by watch [name] on 14 August 2017.  He stated that he had some difficulties even with the device at home, however we were able to record 7 hours and 12 minutes of valid data and the diagnosis of mild sleep apnea was made with an AHI of only 12.5, and loud snoring was associated with an RDI of 18.2/h.  There was no significant oxygen desaturation I would like to add that he has a very slow but regular heart rate at 48 bpm.  The patient tried the auto titration CPAP that I have provided but return the device after about 3 weeks stating he could not tolerate and could not sleep with it.   He is now here to see what kind of alternative treatments that may be. He had used a dental device made to measure in 2018 . He reported his teeth were shifted. He can use a dental device. He was never titrated to the dental device since he moved to [place] from [place]. He would be willing to come in for a dental titration-  With the device made through [facility] in [place].  He is interested in a INSPIRE.    He was last seen here as a new patient  referral from Dr. [name] for a sleep apnea evaluation.  Mr. Vanterpool wakes up with a dry mouth, snores ( wife reported this ) and he has been not resting well. He suspects he has apnea. He rests well, but is not refreshed as he used to be.  2 years ago by the patient lived in  [place] he underwent a home sleep test through a physician, he recalls that he was diagnosed with mild to moderate apnea, he did not want to go to a CPAP route and instead chose a dental device.  He said that the dental device was made for him and is a device that is potentially adjustable, however it led to toothache as his teeth began to shift under the dental therapy. He would now allow for a CPAP, if it is a travel friendly device. Sleep habits are as follows: he allocate at least 6 hours.  Bedtime is 11 - midnight, rise time is 6 AM, on weekends a little later. No electronics in the bedroom.  He works on the computer before he retreats to the bedroom.  Mr. [name] describes his bedroom is cool, quiet and dark.  He shares a bedroom with his wife who has witnessed him to snore, but she has not reported him to stop in his breathing. He has sometimes woken up from his snoring. He sleeps mostly on the side, but he finds himself turning to his back.  He sleeps on 2 pillows, one for the shoulder and one for head and neck.  He has right shoulder pain.  He rarely has nocturia and dreams some night.   Sleep medical history and family sleep history:  No sleep walking history , snoring started 2 years ago , father had apnea and snoring.    Social history:   Married , from [place]. Non smoker, non ETOH, caffeine : tea, 2 a day, Sodas not daily. Coffee less frequent.  He travels for business, sometimes against time zone. Not a shift worker.     Review of Systems: Out of a complete 14 system review, the patient complains of only the following symptoms, and all other reviewed systems are negative. Mild apnea, loud snoring - unable to tolerate CPAP, will try dental device.   Epworth score still 8 , Fatigue severity score 27 , depression score N/a     Social History   Socioeconomic History  . Marital status: Married    Spouse name: Not on file  . Number of children: 2  . Years of education:  Not on file  . Highest education level: Not on file  Occupational History  . Not on file  Social Needs  . Financial resource strain: Not on file  . Food insecurity:    Worry: Not on file    Inability: Not on file  . Transportation needs:    Medical: Not on file    Non-medical: Not on file  Tobacco Use  . Smoking status: Never Smoker  . Smokeless tobacco: Never Used  . Tobacco comment: Regular Exercise - Yes  Substance and Sexual Activity  . Alcohol use: No  . Drug use: No  . Sexual activity: Yes    Birth control/protection: Condom  Lifestyle  . Physical activity:    Days per week: Not on file    Minutes per session: Not on file  . Stress: Not on file  Relationships  . Social connections:    Talks on phone: Not on file    Gets together: Not on file    Attends religious service: Not on file    Active member of club or organization: Not on file    Attends meetings of clubs or organizations: Not on file    Relationship status: Not on file  . Intimate partner violence:    Fear of current or ex partner: Not on file    Emotionally abused: Not on file    Physically abused: Not on file    Forced sexual activity: Not on file  Other Topics Concern  . Not on file  Social History Narrative  . Not on file    Family History  Problem Relation Age of Onset  . Cancer Neg Hx     Past Medical History:  Diagnosis Date  . Hemorrhoids     Past Surgical History:  Procedure Laterality Date  . APPENDECTOMY      No current outpatient medications on file.   No current facility-administered medications for this visit.     Allergies as of 12/01/2017  . (No Known Allergies)    Vitals: BP 103/69   Pulse 66   Ht 5\' 7"  (1.702 m)   Wt 174 lb (78.9 kg)   BMI 27.25 kg/m  Last Weight:  Wt Readings from Last 1 Encounters:  12/01/17 174 lb (78.9 kg)   ZOX:WRUE mass index is 27.25 kg/m.     Last Height:   Ht Readings from Last 1 Encounters:  12/01/17 5\' 7"  (1.702 m)     Physical exam:  General: The patient  is awake, alert and appears not in acute distress. The patient is well groomed. Head: Normocephalic, atraumatic. Neck is supple. Mallampati 3 - lateral narrow, but not tight. , tonsils in place.  neck circumference: 41 cm , 15.75 " Nasal airflow patent ,  Retrognathia is mild .  Cardiovascular:  Regular rate and rhythm, without  murmurs or carotid bruit, and without distended neck veins. Respiratory: Lungs are clear to auscultation. Skin:  Without evidence of edema, or rash Trunk: BMI is 26.7 . The patient's posture is erect.  Neurologic exam : The patient is awake and alert, oriented to place and time.   Attention span & concentration ability appears normal.  Speech is fluent,  without dysarthria, dysphonia or aphasia.  Mood and affect are appropriate.  Cranial nerves: Pupils are equal and briskly reactive to light.  Funduscopic exam without evidence of pallor or edema. Extraocular movements  in vertical and horizontal planes intact and without nystagmus. Visual fields by finger perimetry are intact. Hearing to finger rub intact.   Facial sensation intact to fine touch.  Facial motor strength is symmetric and tongue and uvula move midline. Shoulder shrug was symmetrical.   Motor exam:  Normal tone, muscle bulk and symmetric strength in all extremities. Sensory:  Fine touch, pinprick and vibration were tested in all extremities.  Coordination: Rapid alternating movements in the fingers/hands was normal. Finger-to-nose maneuver  normal without evidence of ataxia, dysmetria or tremor. Gait and station: Patient walks without assistive device and is able unassisted to climb up to the exam table.   Deep tendon reflexes: in the  upper and lower extremities are symmetric and intact.    Assessment:  After physical and neurologic examination, review of laboratory studies,  Personal review of imaging studies, reports of other /same  Imaging studies,  results of polysomnography and / or neurophysiology testing and pre-existing records as far as provided in visit., my assessment is   1)  Likely snoring with mild OSA, I think a HST is sufficient.   2)  Not suffering from  hypersomnia, but yawning frequently.    3) fatigue 27 points-  some times.    The patient was advised of the nature of the diagnosed disorder , the treatment options and the  risks for general health and wellness arising from not treating the condition.  I will order a HST for apnea screening.    I spent more than 40 minutes of face to face time with the patient.  Greater than 50% of time was spent in counseling and coordination of care. We have discussed the diagnosis and differential and I answered the patient's questions.    Plan:  Treatment plan and additional workup :  HST -  Repeated  On his dental device, which is only 53 year old.  May have to come in for titration if not sufficiently controlling apnea.  If not, get onto inspire.   Melvyn Novas, MD   12/01/2017, 3:57 PM  Certified in Neurology by ABPN Certified in Sleep Medicine by [facility] [address] [place], [place] [address]

## 2017-12-01 NOTE — Patient Instructions (Addendum)
Repeat HST while using dental device. Patient had aerophagia on CPAP.

## 2017-12-02 ENCOUNTER — Other Ambulatory Visit: Payer: Self-pay | Admitting: Neurology

## 2017-12-02 ENCOUNTER — Telehealth: Payer: Self-pay

## 2017-12-02 DIAGNOSIS — Z789 Other specified health status: Secondary | ICD-10-CM

## 2017-12-02 DIAGNOSIS — R0683 Snoring: Secondary | ICD-10-CM

## 2017-12-02 DIAGNOSIS — G4733 Obstructive sleep apnea (adult) (pediatric): Secondary | ICD-10-CM

## 2017-12-02 DIAGNOSIS — F40240 Claustrophobia: Secondary | ICD-10-CM

## 2017-12-02 NOTE — Telephone Encounter (Signed)
Insurance will not pay for another HST right now.  But will allow for a in lab study. Per Dr. Vella Redhead given approval) need an order for in lab sleep study.

## 2017-12-02 NOTE — Telephone Encounter (Signed)
Order for in lab study placed.

## 2017-12-02 NOTE — Progress Notes (Signed)
Patient really doesn't want to use CPAp but would like to see if his dental device can be put to use- AHI on and off dental device. . Otherwise considers Inspire procedure.

## 2018-01-13 ENCOUNTER — Telehealth: Payer: Self-pay

## 2018-01-13 NOTE — Telephone Encounter (Signed)
We have attempted to call the patient two times to schedule sleep study.  Patient has been unavailable at the phone numbers we have on file and has not returned our calls.  At this point we will send a letter asking patient to please contact the sleep lab to schedule their sleep study.  If patient calls back we will schedule them for their sleep study. 

## 2018-06-04 ENCOUNTER — Encounter: Payer: BC Managed Care – PPO | Admitting: Family Medicine

## 2018-08-06 ENCOUNTER — Encounter: Payer: BC Managed Care – PPO | Admitting: Family Medicine

## 2018-09-03 ENCOUNTER — Encounter: Payer: BC Managed Care – PPO | Admitting: Family Medicine

## 2018-09-09 ENCOUNTER — Encounter: Payer: Self-pay | Admitting: Family Medicine

## 2018-09-10 ENCOUNTER — Encounter: Payer: Self-pay | Admitting: Family Medicine

## 2018-09-10 ENCOUNTER — Ambulatory Visit (INDEPENDENT_AMBULATORY_CARE_PROVIDER_SITE_OTHER): Payer: BC Managed Care – PPO | Admitting: Family Medicine

## 2018-09-10 ENCOUNTER — Other Ambulatory Visit: Payer: Self-pay

## 2018-09-10 VITALS — BP 110/70 | HR 59 | Temp 98.1°F | Ht 67.0 in | Wt 171.8 lb

## 2018-09-10 DIAGNOSIS — Z1211 Encounter for screening for malignant neoplasm of colon: Secondary | ICD-10-CM | POA: Diagnosis not present

## 2018-09-10 DIAGNOSIS — Z23 Encounter for immunization: Secondary | ICD-10-CM

## 2018-09-10 DIAGNOSIS — Z13220 Encounter for screening for lipoid disorders: Secondary | ICD-10-CM | POA: Diagnosis not present

## 2018-09-10 DIAGNOSIS — M79643 Pain in unspecified hand: Secondary | ICD-10-CM | POA: Diagnosis not present

## 2018-09-10 DIAGNOSIS — K219 Gastro-esophageal reflux disease without esophagitis: Secondary | ICD-10-CM | POA: Diagnosis not present

## 2018-09-10 DIAGNOSIS — M25512 Pain in left shoulder: Secondary | ICD-10-CM

## 2018-09-10 DIAGNOSIS — M25511 Pain in right shoulder: Secondary | ICD-10-CM

## 2018-09-10 DIAGNOSIS — E663 Overweight: Secondary | ICD-10-CM

## 2018-09-10 DIAGNOSIS — Z0001 Encounter for general adult medical examination with abnormal findings: Secondary | ICD-10-CM

## 2018-09-10 LAB — CBC
HCT: 47.1 % (ref 39.0–52.0)
Hemoglobin: 16 g/dL (ref 13.0–17.0)
MCHC: 34 g/dL (ref 30.0–36.0)
MCV: 87.1 fl (ref 78.0–100.0)
Platelets: 215 10*3/uL (ref 150.0–400.0)
RBC: 5.41 Mil/uL (ref 4.22–5.81)
RDW: 12.2 % (ref 11.5–15.5)
WBC: 3.8 10*3/uL — ABNORMAL LOW (ref 4.0–10.5)

## 2018-09-10 LAB — COMPREHENSIVE METABOLIC PANEL
ALT: 17 U/L (ref 0–53)
AST: 19 U/L (ref 0–37)
Albumin: 4.8 g/dL (ref 3.5–5.2)
Alkaline Phosphatase: 76 U/L (ref 39–117)
BUN: 10 mg/dL (ref 6–23)
CO2: 28 mEq/L (ref 19–32)
Calcium: 9.5 mg/dL (ref 8.4–10.5)
Chloride: 104 mEq/L (ref 96–112)
Creatinine, Ser: 0.86 mg/dL (ref 0.40–1.50)
GFR: 92.62 mL/min (ref >60.00)
Glucose, Bld: 90 mg/dL (ref 70–99)
Potassium: 3.7 mEq/L (ref 3.5–5.1)
Sodium: 139 mEq/L (ref 135–145)
Total Bilirubin: 1 mg/dL (ref 0.2–1.2)
Total Protein: 6.8 g/dL (ref 6.0–8.3)

## 2018-09-10 LAB — LIPID PANEL
Cholesterol: 148 mg/dL (ref 0–200)
HDL: 46.4 mg/dL (ref >39.00)
LDL Cholesterol: 86 mg/dL (ref 0–99)
NonHDL: 101.51
Total CHOL/HDL Ratio: 3
Triglycerides: 77 mg/dL (ref 0.0–149.0)
VLDL: 15.4 mg/dL (ref 0.0–40.0)

## 2018-09-10 LAB — TSH: TSH: 0.9 u[IU]/mL (ref 0.35–4.50)

## 2018-09-10 MED ORDER — CIMETIDINE 400 MG PO TABS: 400.0000 mg | ORAL_TABLET | Freq: Two times a day (BID) | ORAL | 11 refills | Status: DC | PRN

## 2018-09-10 NOTE — Assessment & Plan Note (Signed)
Likely underlying OA.  Recommended over-the-counter Voltaren gel.

## 2018-09-10 NOTE — Assessment & Plan Note (Signed)
Send in prescription for Tagamet as needed.

## 2018-09-10 NOTE — Assessment & Plan Note (Signed)
Continue home exercises.  Referral placed PT.

## 2018-09-10 NOTE — Progress Notes (Signed)
Chief Complaint:  Trenda MootsMohamed Gharibian is a 53 y.o. male who presents today for his annual comprehensive physical exam.    Assessment/Plan:  Bilateral shoulder pain Continue home exercises.  Referral placed PT.  Pain of hand Likely underlying OA.  Recommended over-the-counter Voltaren gel.  Gastroesophageal reflux disease Send in prescription for Tagamet as needed.   Body mass index is 26.9 kg/m. / Overweight BMI Metric Follow Up - 09/10/18 0900      BMI Metric Follow Up-Please document annually   BMI Metric Follow Up  Education provided        Preventative Healthcare: Due for cologuard next year. Tdap given today. Flu vaccine due later this year.   Patient Counseling(The following topics were reviewed and/or handout was given):  -Nutrition: Stressed importance of moderation in sodium/caffeine intake, saturated fat and cholesterol, caloric balance, sufficient intake of fresh fruits, vegetables, and fiber.  -Stressed the importance of regular exercise.   -Substance Abuse: Discussed cessation/primary prevention of tobacco, alcohol, or other drug use; driving or other dangerous activities under the influence; availability of treatment for abuse.   -Injury prevention: Discussed safety belts, safety helmets, smoke detector, smoking near bedding or upholstery.   -Sexuality: Discussed sexually transmitted diseases, partner selection, use of condoms, avoidance of unintended pregnancy and contraceptive alternatives.   -Dental health: Discussed importance of regular tooth brushing, flossing, and dental visits.  -Health maintenance and immunizations reviewed. Please refer to Health maintenance section.  Return to care in 1 year for next preventative visit.     Subjective:  HPI:  He has no acute complaints today.   Shoulder Pain Patient with chronic history of right shoulder pain.  He is also began to develop symptoms in his left shoulder as well.  Over the past year is seen several  sports medicine physicians who have recommended home exercises.  He has declined intra-articular corticosteroid injection several times.  He has been working on home exercises and symptoms seem to be improving however still has limited range motion in bilateral shoulders with left worse than right.  Pain is worse in the morning.  Does not take any medications.  No other obvious alleviating or aggravating factors.  Reflux Also noticed worsening over the past year.  He is not sure certain foods makes it worse.  Take Tums which helps with his symptoms.  He has also had some hand pain that is stable. Mostly located in distal fingers.   Lifestyle Diet: No specific diets or eating plans.  Exercise: Likes to walk. Likes to hike on the weekends.   Depression screen PHQ 2/9 09/10/2018  Decreased Interest 0  Down, Depressed, Hopeless 0  PHQ - 2 Score 0    Health Maintenance Due  Topic Date Due  . HIV Screening  07/21/1979  . TETANUS/TDAP  02/04/2014  . INFLUENZA VACCINE  09/05/2018     ROS: Per HPI, otherwise a complete review of systems was negative.   PMH:  The following were reviewed and entered/updated in epic: Past Medical History:  Diagnosis Date  . Hemorrhoids    Patient Active Problem List   Diagnosis Date Noted  . Gastroesophageal reflux disease 09/10/2018  . Pain of hand 09/10/2018  . Bilateral shoulder pain 09/10/2018  . Difficulty with CPAP use 12/01/2017  . Mild obstructive sleep apnea 12/01/2017  . BENIGN PROSTATIC HYPERTROPHY, WITH URINARY OBSTRUCTION 07/13/2008   Past Surgical History:  Procedure Laterality Date  . APPENDECTOMY      Family History  Problem Relation Age of  Onset  . Cancer Neg Hx     Medications- reviewed and updated Current Outpatient Medications  Medication Sig Dispense Refill  . cimetidine (TAGAMET) 400 MG tablet Take 1 tablet (400 mg total) by mouth 2 (two) times daily as needed. 60 tablet 11   No current facility-administered  medications for this visit.     Allergies-reviewed and updated No Known Allergies  Social History   Socioeconomic History  . Marital status: Married    Spouse name: Not on file  . Number of children: 2  . Years of education: Not on file  . Highest education level: Not on file  Occupational History  . Not on file  Social Needs  . Financial resource strain: Not on file  . Food insecurity    Worry: Not on file    Inability: Not on file  . Transportation needs    Medical: Not on file    Non-medical: Not on file  Tobacco Use  . Smoking status: Never Smoker  . Smokeless tobacco: Never Used  . Tobacco comment: Regular Exercise - Yes  Substance and Sexual Activity  . Alcohol use: No  . Drug use: No  . Sexual activity: Yes    Birth control/protection: Condom  Lifestyle  . Physical activity    Days per week: Not on file    Minutes per session: Not on file  . Stress: Not on file  Relationships  . Social Herbalist on phone: Not on file    Gets together: Not on file    Attends religious service: Not on file    Active member of club or organization: Not on file    Attends meetings of clubs or organizations: Not on file    Relationship status: Not on file  Other Topics Concern  . Not on file  Social History Narrative  . Not on file        Objective:  Physical Exam: BP 110/70 (BP Location: Left Arm, Patient Position: Sitting, Cuff Size: Normal)   Pulse (!) 59   Temp 98.1 F (36.7 C) (Oral)   Ht 5\' 7"  (1.702 m)   Wt 171 lb 12 oz (77.9 kg)   SpO2 99%   BMI 26.90 kg/m   Body mass index is 26.9 kg/m. Wt Readings from Last 3 Encounters:  09/10/18 171 lb 12 oz (77.9 kg)  12/01/17 174 lb (78.9 kg)  11/11/17 160 lb (72.6 kg)   Gen: NAD, resting comfortably HEENT: TMs normal bilaterally. OP clear. No thyromegaly noted.  CV: RRR with no murmurs appreciated Pulm: NWOB, CTAB with no crackles, wheezes, or rhonchi GI: Normal bowel sounds present. Soft,  Nontender, Nondistended. MSK: no edema, cyanosis, or clubbing noted.  Bilateral shoulders with limited range of motion Skin: warm, dry Neuro: CN2-12 grossly intact. Strength 5/5 in upper and lower extremities. Reflexes symmetric and intact bilaterally.  Psych: Normal affect and thought content     Caleb M. Jerline Pain, MD 09/10/2018 9:27 AM

## 2018-09-10 NOTE — Patient Instructions (Signed)
It was very nice to see you today!  I will place a referral for your shoulder pain.  Please try the Tagamet for your reflux as needed.  Try the Voltaren gel as needed for your hand pain.  We will check blood work today.  Come back in 1 year for your next physical, or sooner as needed.  Take care, Dr Jerline Pain  Please try these tips to maintain a healthy lifestyle:   Eat at least 3 REAL meals and 1-2 snacks per day.  Aim for no more than 5 hours between eating.  If you eat breakfast, please do so within one hour of getting up.    Obtain twice as many fruits/vegetables as protein or carbohydrate foods for both lunch and dinner. (Half of each meal should be fruits/vegetables, one quarter protein, and one quarter starchy carbs)   Cut down on sweet beverages. This includes juice, soda, and sweet tea.    Exercise at least 150 minutes every week.    Preventive Care 69-47 Years Old, Male Preventive care refers to lifestyle choices and visits with your health care provider that can promote health and wellness. This includes:  A yearly physical exam. This is also called an annual well check.  Regular dental and eye exams.  Immunizations.  Screening for certain conditions.  Healthy lifestyle choices, such as eating a healthy diet, getting regular exercise, not using drugs or products that contain nicotine and tobacco, and limiting alcohol use. What can I expect for my preventive care visit? Physical exam Your health care provider will check:  Height and weight. These may be used to calculate body mass index (BMI), which is a measurement that tells if you are at a healthy weight.  Heart rate and blood pressure.  Your skin for abnormal spots. Counseling Your health care provider may ask you questions about:  Alcohol, tobacco, and drug use.  Emotional well-being.  Home and relationship well-being.  Sexual activity.  Eating habits.  Work and work Statistician. What  immunizations do I need?  Influenza (flu) vaccine  This is recommended every year. Tetanus, diphtheria, and pertussis (Tdap) vaccine  You may need a Td booster every 10 years. Varicella (chickenpox) vaccine  You may need this vaccine if you have not already been vaccinated. Zoster (shingles) vaccine  You may need this after age 9. Measles, mumps, and rubella (MMR) vaccine  You may need at least one dose of MMR if you were born in 1957 or later. You may also need a second dose. Pneumococcal conjugate (PCV13) vaccine  You may need this if you have certain conditions and were not previously vaccinated. Pneumococcal polysaccharide (PPSV23) vaccine  You may need one or two doses if you smoke cigarettes or if you have certain conditions. Meningococcal conjugate (MenACWY) vaccine  You may need this if you have certain conditions. Hepatitis A vaccine  You may need this if you have certain conditions or if you travel or work in places where you may be exposed to hepatitis A. Hepatitis B vaccine  You may need this if you have certain conditions or if you travel or work in places where you may be exposed to hepatitis B. Haemophilus influenzae type b (Hib) vaccine  You may need this if you have certain risk factors. Human papillomavirus (HPV) vaccine  If recommended by your health care provider, you may need three doses over 6 months. You may receive vaccines as individual doses or as more than one vaccine together in one shot (  combination vaccines). Talk with your health care provider about the risks and benefits of combination vaccines. What tests do I need? Blood tests  Lipid and cholesterol levels. These may be checked every 5 years, or more frequently if you are over 73 years old.  Hepatitis C test.  Hepatitis B test. Screening  Lung cancer screening. You may have this screening every year starting at age 52 if you have a 30-pack-year history of smoking and currently smoke  or have quit within the past 15 years.  Prostate cancer screening. Recommendations will vary depending on your family history and other risks.  Colorectal cancer screening. All adults should have this screening starting at age 38 and continuing until age 21. Your health care provider may recommend screening at age 29 if you are at increased risk. You will have tests every 1-10 years, depending on your results and the type of screening test.  Diabetes screening. This is done by checking your blood sugar (glucose) after you have not eaten for a while (fasting). You may have this done every 1-3 years.  Sexually transmitted disease (STD) testing. Follow these instructions at home: Eating and drinking  Eat a diet that includes fresh fruits and vegetables, whole grains, lean protein, and low-fat dairy products.  Take vitamin and mineral supplements as recommended by your health care provider.  Do not drink alcohol if your health care provider tells you not to drink.  If you drink alcohol: ? Limit how much you have to 0-2 drinks a day. ? Be aware of how much alcohol is in your drink. In the [place] one drink equals one 12 oz bottle of beer (355 mL), one 5 oz glass of wine (148 mL), or one 1 oz glass of hard liquor (44 mL). Lifestyle  Take daily care of your teeth and gums.  Stay active. Exercise for at least 30 minutes on 5 or more days each week.  Do not use any products that contain nicotine or tobacco, such as cigarettes, e-cigarettes, and chewing tobacco. If you need help quitting, ask your health care provider.  If you are sexually active, practice safe sex. Use a condom or other form of protection to prevent STIs (sexually transmitted infections).  Talk with your health care provider about taking a low-dose aspirin every day starting at age 16. What's next?  Go to your health care provider once a year for a well check visit.  Ask your health care provider how often you should have  your eyes and teeth checked.  Stay up to date on all vaccines. This information is not intended to replace advice given to you by your health care provider. Make sure you discuss any questions you have with your health care provider. Document Released: 02/17/2015 Document Revised: 01/15/2018 Document Reviewed: 01/15/2018 Elsevier Patient Education  2020 [employer].

## 2018-09-10 NOTE — Progress Notes (Signed)
Dr Marigene Ehlers interpretation of your lab work:  Good news! Your blood work is all NORMAL. Keep up the good work and we can recheck again in a year.    If you have any additional questions, please give [place] a call or send [place] a message through Tool.  Take care, Dr Jerline Pain

## 2018-09-21 ENCOUNTER — Other Ambulatory Visit: Payer: Self-pay

## 2018-09-21 ENCOUNTER — Encounter: Payer: Self-pay | Admitting: Physical Therapy

## 2018-09-21 ENCOUNTER — Ambulatory Visit (INDEPENDENT_AMBULATORY_CARE_PROVIDER_SITE_OTHER): Payer: BC Managed Care – PPO | Admitting: Physical Therapy

## 2018-09-21 DIAGNOSIS — M25611 Stiffness of right shoulder, not elsewhere classified: Secondary | ICD-10-CM

## 2018-09-21 DIAGNOSIS — M25511 Pain in right shoulder: Secondary | ICD-10-CM | POA: Diagnosis not present

## 2018-09-21 DIAGNOSIS — M6281 Muscle weakness (generalized): Secondary | ICD-10-CM

## 2018-09-21 DIAGNOSIS — M25612 Stiffness of left shoulder, not elsewhere classified: Secondary | ICD-10-CM | POA: Diagnosis not present

## 2018-09-21 DIAGNOSIS — G8929 Other chronic pain: Secondary | ICD-10-CM

## 2018-09-21 DIAGNOSIS — M25512 Pain in left shoulder: Secondary | ICD-10-CM | POA: Diagnosis not present

## 2018-09-21 NOTE — Therapy (Signed)
[facility] [address] [place], [place], [address] Phone: [phone]   Fax:  [phone]  Physical Therapy Evaluation  Patient Details  Name: Wyatt Joseph MRN: [id number] Date of Birth: 05/09/1965 Referring Provider (PT): Wyatt Joseph   Encounter Date: 09/21/2018  PT End of Session - 09/21/18 0954    Visit Number  1    Number of Visits  12    Date for PT Re-Evaluation  11/02/18    Authorization Type  BCBS    PT Start Time  [phone]    PT Stop Time  0930    PT Time Calculation (min)  43 min    Activity Tolerance  Patient tolerated treatment well    Behavior During Therapy  [facility] for tasks assessed/performed       Past Medical History:  Diagnosis Date  . Hemorrhoids     Past Surgical History:  Procedure Laterality Date  . APPENDECTOMY      There were no vitals filed for this visit.   Subjective Assessment - 09/21/18 0852    Subjective  Pt has had pain for over 1 year. He states much stiffness at first, now loosening up, but still cant lift arms up overhead. Pt states pain first thing in AM and at night. Does have trouble sleeping. He works mostly in an office, on [employer]. He has seen chiro in past but no PT. He has seen 2 ortho MDs, no injections or treatment yet.    Limitations  Lifting;House hold activities    Patient Stated Goals  Improved ROM, decreased pain at night    Currently in Pain?  Yes    Pain Score  5     Pain Location  Shoulder    Pain Orientation  Left;Right    Pain Descriptors / Indicators  Aching    Pain Type  Chronic pain    Pain Onset  More than a month ago    Pain Frequency  Intermittent    Aggravating Factors   first thing in AM, and at night    Pain Relieving Factors  movement         [facility] Assessment - 09/21/18 0001      Assessment   Medical Diagnosis  Bilateral shoulder pain    Referring Provider (PT)  Wyatt Joseph    Hand Dominance  Right    Prior Therapy  No      Balance Screen    Has the patient fallen in the past 6 months  No      Prior Function   Level of Independence  Independent      Cognition   Overall Cognitive Status  Within Functional Limits for tasks assessed      ROM / Strength   AROM / PROM / Strength  AROM;PROM;Strength      AROM   AROM Assessment Site  Shoulder    Right/Left Shoulder  Right;Left    Right Shoulder Flexion  125 Degrees    Right Shoulder ABduction  95 Degrees    Right Shoulder Internal Rotation  70 Degrees    Right Shoulder External Rotation  70 Degrees    Left Shoulder Flexion  120 Degrees    Left Shoulder ABduction  95 Degrees    Left Shoulder Internal Rotation  65 Degrees    Left Shoulder External Rotation  65 Degrees      PROM   PROM Assessment Site  Shoulder    Right/Left Shoulder  Right;Left  Right Shoulder Flexion  155 Degrees    Right Shoulder ABduction  155 Degrees    Right Shoulder Internal Rotation  70 Degrees    Right Shoulder External Rotation  70 Degrees    Left Shoulder Flexion  155 Degrees    Left Shoulder ABduction  155 Degrees    Left Shoulder Internal Rotation  65 Degrees      Strength   Strength Assessment Site  Shoulder    Right/Left Shoulder  Right;Left    Right Shoulder Flexion  3-/5    Right Shoulder ABduction  3-/5    Right Shoulder Internal Rotation  4/5    Right Shoulder External Rotation  4/5    Left Shoulder Flexion  3-/5    Left Shoulder ABduction  3-/5    Left Shoulder Internal Rotation  4/5    Left Shoulder External Rotation  4/5      Palpation   Palpation comment  Hypomobility of L GHJ > R                 Objective measurements completed on examination: See above findings.      [place] Adult PT Treatment/Exercise - 09/21/18 0001      Exercises   Exercises  Shoulder      Shoulder Exercises: Supine   External Rotation  AAROM;15 reps    External Rotation Limitations  cane 2 lb    Flexion  AROM;15 reps    Flexion Limitations  cane 2 lb    Other Supine Exercises   ER butterfly 20 sec x5;       Shoulder Exercises: Standing   Flexion  AAROM;10 reps    ABduction  AROM;10 reps      Shoulder Exercises: Stretch   Internal Rotation Stretch  5 reps    Internal Rotation Stretch Limitations  hands behind back;       Manual Therapy   Manual Therapy  Soft tissue mobilization;Passive ROM             PT Education - 09/21/18 0953    Education Details  PT POC, exam findings, HEP    Person(s) Educated  Patient    Methods  Explanation;Demonstration;Tactile cues;Verbal cues;Handout    Comprehension  Verbalized understanding;Tactile cues required          PT Long Term Goals - 09/21/18 0957      PT LONG TERM GOAL #1   Title  Pt to demo full PROM of bil shoulders    Time  6    Period  Weeks    Status  New    Target Date  11/02/18      PT LONG TERM GOAL #2   Title  Pt to demo ability for full AROM of bil Shoulders, to improve ability for IADLS.    Time  6    Period  Weeks    Status  New    Target Date  11/02/18      PT LONG TERM GOAL #3   Title  Pt to demo increased strength of bil shoulder elevation to be at least 4/5 to improve ability for IADLS, lifting, carrying, and reaching.    Time  6    Period  Weeks    Status  New    Target Date  11/02/18      PT LONG TERM GOAL #4   Title  Pt to report decreased pain in bil shoulders to 0-2/10 , to improve sleep and functional activity .  Time  6    Period  Weeks    Status  New    Target Date  11/02/18             Plan - 09/21/18 0959    Clinical Impression Statement  Pt presents wtih primary complaint of increased pain in bilateral shoulders. He has mild stiffness and hypomobility bilaterally L>R. He has muscle weakness as well, that is preventing full AROM. Pt with decreased ability for full functional activities, reaching, lifting, carrying, ADLS and IADLS due to deficits. Pt to benefit from skilled PT to improve deficits and pain.    Personal Factors and Comorbidities  Time  since onset of injury/illness/exacerbation    Examination-Activity Limitations  Reach Overhead;Sleep;Carry;Dressing;Hygiene/Grooming;Lift    Examination-Participation Restrictions  Cleaning;Community Activity    Stability/Clinical Decision Making  Stable/Uncomplicated    Clinical Decision Making  Low    Rehab Potential  Good    PT Frequency  2x / week    PT Duration  6 weeks    PT Treatment/Interventions  ADLs/Self Care Home Management;Cryotherapy;Electrical Stimulation;Ultrasound;Moist Heat;Iontophoresis 4mg /ml Dexamethasone;Gait training;Functional mobility training;Therapeutic activities;Therapeutic exercise;Balance training;Neuromuscular re-education;Manual techniques;Orthotic Fit/Training;Patient/family education;Passive range of motion;Dry needling;Spinal Manipulations;Joint Manipulations;Taping    Consulted and Agree with Plan of Care  Patient       Patient will benefit from skilled therapeutic intervention in order to improve the following deficits and impairments:  Decreased range of motion, Increased muscle spasms, Impaired UE functional use, Pain, Hypomobility, Impaired flexibility, Improper body mechanics, Decreased strength, Decreased mobility  Visit Diagnosis: 1. Chronic left shoulder pain   2. Chronic right shoulder pain   3. Stiffness of left shoulder, not elsewhere classified   4. Stiffness of right shoulder, not elsewhere classified   5. Muscle weakness (generalized)        Problem List Patient Active Problem List   Diagnosis Date Noted  . Gastroesophageal reflux disease 09/10/2018  . Pain of hand 09/10/2018  . Bilateral shoulder pain 09/10/2018  . Difficulty with CPAP use 12/01/2017  . Mild obstructive sleep apnea 12/01/2017  . BENIGN PROSTATIC HYPERTROPHY, WITH URINARY OBSTRUCTION 07/13/2008    Sedalia MutaLauren Soniya Ashraf, PT, DPT 10:06 AM  09/21/18    [facility] [address] [place], [place], [address] Phone:  [phone]   Fax:  [phone]  Name: Wyatt Joseph MRN: [id number] Date of Birth: Aug 28, 1965

## 2018-09-21 NOTE — Patient Instructions (Signed)
Access Code: [email or web address]  URL: [email or web address]  Date: 09/21/2018  Prepared by: Lyndee Hensen   Exercises Supine Shoulder Flexion with Dowel - 10 reps - 1 sets - 10 hold - 2x daily Supine Shoulder External Rotation with Dowel - 10 reps - 1 sets - 10 hold - 2x daily Standing Shoulder Internal Rotation Stretch with Towel - 5 reps - 1 sets - 10 hold - 2x daily Supine Chest Stretch with Elbows Bent - 3 reps - 30 hold - 2x daily Standing Shoulder Abduction AAROM with Dowel - 10 reps - 1-2 sets - 5 hold - 2x daily

## 2018-09-24 ENCOUNTER — Other Ambulatory Visit: Payer: Self-pay

## 2018-09-24 ENCOUNTER — Encounter: Payer: Self-pay | Admitting: Physical Therapy

## 2018-09-24 ENCOUNTER — Ambulatory Visit: Payer: BC Managed Care – PPO | Admitting: Physical Therapy

## 2018-09-24 DIAGNOSIS — M25611 Stiffness of right shoulder, not elsewhere classified: Secondary | ICD-10-CM | POA: Diagnosis not present

## 2018-09-24 DIAGNOSIS — M25512 Pain in left shoulder: Secondary | ICD-10-CM

## 2018-09-24 DIAGNOSIS — M6281 Muscle weakness (generalized): Secondary | ICD-10-CM

## 2018-09-24 DIAGNOSIS — M25612 Stiffness of left shoulder, not elsewhere classified: Secondary | ICD-10-CM

## 2018-09-24 DIAGNOSIS — G8929 Other chronic pain: Secondary | ICD-10-CM

## 2018-09-24 DIAGNOSIS — M25511 Pain in right shoulder: Secondary | ICD-10-CM

## 2018-09-24 NOTE — Therapy (Signed)
[facility] [address] [place], [place], [address] Phone: [phone]   Fax:  [phone]  Physical Therapy Treatment  Patient Details  Name: Wyatt MootsMohamed Joseph MRN: [id number] Date of Birth: April 30, 1965 Referring Provider (PT): Jacquiline Doealeb Parker   Encounter Date: 09/24/2018  PT End of Session - 09/24/18 0948    Visit Number  2    Number of Visits  12    Date for PT Re-Evaluation  11/02/18    Authorization Type  BCBS    PT Start Time  0845    PT Stop Time  0928    PT Time Calculation (min)  43 min    Activity Tolerance  Patient tolerated treatment well    Behavior During Therapy  [facility] for tasks assessed/performed       Past Medical History:  Diagnosis Date  . Hemorrhoids     Past Surgical History:  Procedure Laterality Date  . APPENDECTOMY      There were no vitals filed for this visit.  Subjective Assessment - 09/24/18 0947    Subjective  Pt with no new complaints. Has been doing HEP.    Currently in Pain?  Yes    Pain Score  3     Pain Location  Shoulder    Pain Orientation  Right;Left    Pain Descriptors / Indicators  Aching    Pain Type  Chronic pain    Pain Onset  More than a month ago    Pain Frequency  Intermittent                       [facility] Adult PT Treatment/Exercise - 09/24/18 0857      Exercises   Exercises  Shoulder      Shoulder Exercises: Supine   External Rotation  AAROM;20 reps    External Rotation Limitations  cane    Flexion  AROM;20 reps    Flexion Limitations  cane 2 lb    Other Supine Exercises  --      Shoulder Exercises: Standing   Flexion  AROM;20 reps    ABduction  AROM;20 reps    Row  20 reps    Theraband Level (Shoulder Row)  Level 3 (Green)    Other Standing Exercises  Wall slides x10 bil;     Other Standing Exercises  scap retract/low row x15       Shoulder Exercises: Pulleys   Flexion  3 minutes      Shoulder Exercises: Stretch   Internal Rotation Stretch  5  reps    Internal Rotation Stretch Limitations  hands behind back; x15      Manual Therapy   Manual Therapy  Soft tissue mobilization;Passive ROM;Joint mobilization    Joint Mobilization  For Bil GHJ, inf and post mobs gr 3     Passive ROM  For Bil  GHJ, all motions             PT Education - 09/24/18 0947    Education Details  Reviewed HEP    Person(s) Educated  Patient    Methods  Explanation;Demonstration    Comprehension  Verbalized understanding          PT Long Term Goals - 09/21/18 0957      PT LONG TERM GOAL #1   Title  Pt to demo full PROM of bil shoulders    Time  6    Period  Weeks    Status  New  Target Date  11/02/18      PT LONG TERM GOAL #2   Title  Pt to demo ability for full AROM of bil Shoulders, to improve ability for IADLS.    Time  6    Period  Weeks    Status  New    Target Date  11/02/18      PT LONG TERM GOAL #3   Title  Pt to demo increased strength of bil shoulder elevation to be at least 4/5 to improve ability for IADLS, lifting, carrying, and reaching.    Time  6    Period  Weeks    Status  New    Target Date  11/02/18      PT LONG TERM GOAL #4   Title  Pt to report decreased pain in bil shoulders to 0-2/10 , to improve sleep and functional activity .    Time  6    Period  Weeks    Status  New    Target Date  11/02/18            Plan - 09/24/18 0950    Clinical Impression Statement  Focus on manual PROM and joint mobs for increasing ROM. Pt with slight improvement for AROM for elevation today. Rows added to HEP. Pt requires mod-max cuing to decrease compensation from UT with most exercises.    Personal Factors and Comorbidities  Time since onset of injury/illness/exacerbation    Examination-Activity Limitations  Reach Overhead;Sleep;Carry;Dressing;Hygiene/Grooming;Lift    Examination-Participation Restrictions  Cleaning;Community Activity    Stability/Clinical Decision Making  Stable/Uncomplicated    Rehab Potential   Good    PT Frequency  2x / week    PT Duration  6 weeks    PT Treatment/Interventions  ADLs/Self Care Home Management;Cryotherapy;Electrical Stimulation;Ultrasound;Moist Heat;Iontophoresis 4mg /ml Dexamethasone;Gait training;Functional mobility training;Therapeutic activities;Therapeutic exercise;Balance training;Neuromuscular re-education;Manual techniques;Orthotic Fit/Training;Patient/family education;Passive range of motion;Dry needling;Spinal Manipulations;Joint Manipulations;Taping    Consulted and Agree with Plan of Care  Patient       Patient will benefit from skilled therapeutic intervention in order to improve the following deficits and impairments:  Decreased range of motion, Increased muscle spasms, Impaired UE functional use, Pain, Hypomobility, Impaired flexibility, Improper body mechanics, Decreased strength, Decreased mobility  Visit Diagnosis: Chronic left shoulder pain  Chronic right shoulder pain  Stiffness of left shoulder, not elsewhere classified  Stiffness of right shoulder, not elsewhere classified  Muscle weakness (generalized)     Problem List Patient Active Problem List   Diagnosis Date Noted  . Gastroesophageal reflux disease 09/10/2018  . Pain of hand 09/10/2018  . Bilateral shoulder pain 09/10/2018  . Difficulty with CPAP use 12/01/2017  . Mild obstructive sleep apnea 12/01/2017  . BENIGN PROSTATIC HYPERTROPHY, WITH URINARY OBSTRUCTION 07/13/2008    Lyndee Hensen, PT, DPT 9:51 AM  09/24/18    [facility] [place], [place], [address] Phone: [phone]   Fax:  [phone]  Name: Wyatt Joseph MRN: [id number] Date of Birth: March 18, 1965

## 2018-09-28 ENCOUNTER — Ambulatory Visit (INDEPENDENT_AMBULATORY_CARE_PROVIDER_SITE_OTHER): Payer: BC Managed Care – PPO | Admitting: Physical Therapy

## 2018-09-28 ENCOUNTER — Encounter: Payer: Self-pay | Admitting: Physical Therapy

## 2018-09-28 ENCOUNTER — Other Ambulatory Visit: Payer: Self-pay

## 2018-09-28 DIAGNOSIS — M25612 Stiffness of left shoulder, not elsewhere classified: Secondary | ICD-10-CM | POA: Diagnosis not present

## 2018-09-28 DIAGNOSIS — M25511 Pain in right shoulder: Secondary | ICD-10-CM

## 2018-09-28 DIAGNOSIS — G8929 Other chronic pain: Secondary | ICD-10-CM

## 2018-09-28 DIAGNOSIS — M6281 Muscle weakness (generalized): Secondary | ICD-10-CM

## 2018-09-28 DIAGNOSIS — M25512 Pain in left shoulder: Secondary | ICD-10-CM

## 2018-09-28 DIAGNOSIS — M25611 Stiffness of right shoulder, not elsewhere classified: Secondary | ICD-10-CM

## 2018-09-28 NOTE — Therapy (Signed)
[facility] [address] [place], [place], [address] Phone: [phone]   Fax:  [phone]  Physical Therapy Treatment  Patient Details  Name: Wyatt Joseph MRN: [id number] Date of Birth: 1965/08/02 Referring Provider (PT): Dimas Chyle   Encounter Date: 09/28/2018  PT End of Session - 09/28/18 0821    Visit Number  3    Number of Visits  12    Date for PT Re-Evaluation  11/02/18    Authorization Type  BCBS    PT Start Time  0820    PT Stop Time  0902    PT Time Calculation (min)  42 min    Activity Tolerance  Patient tolerated treatment well    Behavior During Therapy  [facility] for tasks assessed/performed       Past Medical History:  Diagnosis Date  . Hemorrhoids     Past Surgical History:  Procedure Laterality Date  . APPENDECTOMY      There were no vitals filed for this visit.  Subjective Assessment - 09/28/18 0824    Subjective  Pt states soreness mostly in AM, and is uncomfortable through the night. Is doing HEP. Pt 10 min late to appt today    Currently in Pain?  Yes    Pain Score  3     Pain Location  Shoulder    Pain Orientation  Right;Left    Pain Descriptors / Indicators  Aching    Pain Type  Chronic pain    Pain Onset  More than a month ago    Pain Frequency  Intermittent                       [facility] Adult PT Treatment/Exercise - 09/28/18 0826      Exercises   Exercises  Shoulder      Shoulder Exercises: Supine   Protraction Limitations  SA punch / cane x20;     External Rotation  --    External Rotation Limitations  --    Flexion  AROM;20 reps    Flexion Limitations  cane 2 lb      Shoulder Exercises: Sidelying   External Rotation  Both;15 reps    External Rotation Weight (lbs)  2      Shoulder Exercises: Standing   Flexion  AROM;10 reps    ABduction  AROM;10 reps    Row  20 reps    Theraband Level (Shoulder Row)  Level 3 (Green)    Other Standing Exercises  Wall slides x10  bil;  Wall push ups x20;     Other Standing Exercises  --      Shoulder Exercises: Pulleys   Flexion  2 minutes    Scaption  2 minutes      Shoulder Exercises: Stretch   Corner Stretch  3 reps;30 seconds    Corner Stretch Limitations  doorway/ 45 deg;     Internal Rotation Stretch  5 reps    Internal Rotation Stretch Limitations  hands behind back; x15      Manual Therapy   Manual Therapy  Soft tissue mobilization;Passive ROM;Joint mobilization    Joint Mobilization  For Bil GHJ, inf and post mobs gr 3     Passive ROM  For Bil  GHJ, all motions                  PT Long Term Goals - 09/21/18 0957      PT LONG TERM GOAL #1  Title  Pt to demo full PROM of bil shoulders    Time  6    Period  Weeks    Status  New    Target Date  11/02/18      PT LONG TERM GOAL #2   Title  Pt to demo ability for full AROM of bil Shoulders, to improve ability for IADLS.    Time  6    Period  Weeks    Status  New    Target Date  11/02/18      PT LONG TERM GOAL #3   Title  Pt to demo increased strength of bil shoulder elevation to be at least 4/5 to improve ability for IADLS, lifting, carrying, and reaching.    Time  6    Period  Weeks    Status  New    Target Date  11/02/18      PT LONG TERM GOAL #4   Title  Pt to report decreased pain in bil shoulders to 0-2/10 , to improve sleep and functional activity .    Time  6    Period  Weeks    Status  New    Target Date  11/02/18            Plan - 09/28/18 0925    Clinical Impression Statement  Pt with improving PROM measurements, as well as improving ability for AROM. He does have mild PROM limitations , mostly with flexion and rotation bilaterally. Pt to benefit from continued progression of strengthening    Personal Factors and Comorbidities  Time since onset of injury/illness/exacerbation    Examination-Activity Limitations  Reach Overhead;Sleep;Carry;Dressing;Hygiene/Grooming;Lift    Examination-Participation  Restrictions  Cleaning;Community Activity    Stability/Clinical Decision Making  Stable/Uncomplicated    Rehab Potential  Good    PT Frequency  2x / week    PT Duration  6 weeks    PT Treatment/Interventions  ADLs/Self Care Home Management;Cryotherapy;Electrical Stimulation;Ultrasound;Moist Heat;Iontophoresis 4mg /ml Dexamethasone;Gait training;Functional mobility training;Therapeutic activities;Therapeutic exercise;Balance training;Neuromuscular re-education;Manual techniques;Orthotic Fit/Training;Patient/family education;Passive range of motion;Dry needling;Spinal Manipulations;Joint Manipulations;Taping    Consulted and Agree with Plan of Care  Patient       Patient will benefit from skilled therapeutic intervention in order to improve the following deficits and impairments:  Decreased range of motion, Increased muscle spasms, Impaired UE functional use, Pain, Hypomobility, Impaired flexibility, Improper body mechanics, Decreased strength, Decreased mobility  Visit Diagnosis: Chronic left shoulder pain  Chronic right shoulder pain  Stiffness of left shoulder, not elsewhere classified  Stiffness of right shoulder, not elsewhere classified  Muscle weakness (generalized)     Problem List Patient Active Problem List   Diagnosis Date Noted  . Gastroesophageal reflux disease 09/10/2018  . Pain of hand 09/10/2018  . Bilateral shoulder pain 09/10/2018  . Difficulty with CPAP use 12/01/2017  . Mild obstructive sleep apnea 12/01/2017  . BENIGN PROSTATIC HYPERTROPHY, WITH URINARY OBSTRUCTION 07/13/2008    Sedalia MutaLauren Lurie Mullane, PT, DPT 9:27 AM  09/28/18    [facility] [address] [place], [place], [address] Phone: [phone]   Fax:  [phone]  Name: Wyatt Joseph MRN: [id number] Date of Birth: [birth year]

## 2018-10-05 ENCOUNTER — Ambulatory Visit (INDEPENDENT_AMBULATORY_CARE_PROVIDER_SITE_OTHER): Payer: BC Managed Care – PPO | Admitting: Physical Therapy

## 2018-10-05 ENCOUNTER — Encounter: Payer: Self-pay | Admitting: Physical Therapy

## 2018-10-05 ENCOUNTER — Other Ambulatory Visit: Payer: Self-pay

## 2018-10-05 DIAGNOSIS — G8929 Other chronic pain: Secondary | ICD-10-CM

## 2018-10-05 DIAGNOSIS — M25611 Stiffness of right shoulder, not elsewhere classified: Secondary | ICD-10-CM

## 2018-10-05 DIAGNOSIS — M25512 Pain in left shoulder: Secondary | ICD-10-CM

## 2018-10-05 DIAGNOSIS — M25511 Pain in right shoulder: Secondary | ICD-10-CM

## 2018-10-05 DIAGNOSIS — M25612 Stiffness of left shoulder, not elsewhere classified: Secondary | ICD-10-CM

## 2018-10-05 DIAGNOSIS — M6281 Muscle weakness (generalized): Secondary | ICD-10-CM

## 2018-10-05 NOTE — Patient Instructions (Signed)
Access Code: [email or web address]  URL: [email or web address]  Date: 10/05/2018  Prepared by: Lyndee Hensen   Exercises Supine Shoulder Flexion with Dowel - 10 reps - 1 sets - 10 hold - 2x daily Supine Shoulder External Rotation with Dowel - 10 reps - 1 sets - 10 hold - 2x daily Standing Shoulder Internal Rotation Stretch with Towel - 5 reps - 1 sets - 10 hold - 2x daily Supine Chest Stretch with Elbows Bent - 3 reps - 30 hold - 2x daily Sidelying Shoulder External Rotation - 10 reps - 2 sets - 1x daily Standing Full Range Shoulder Flexion with Dumbbells - 10 reps - 1 sets - 2x daily Scapular Retraction with Resistance - 10 reps - 2 sets - 1x daily Tricep Push Up on Wall - 10 reps - 2 sets - 1x daily Doorway Pec Stretch at 60 Elevation - 3 reps - 30 hold - 2x daily Shoulder Internal Rotation with Resistance - 10 reps - 1 sets - 1x daily Shoulder External Rotation with Anchored Resistance - 10 reps - 1 sets - 1x daily

## 2018-10-05 NOTE — Therapy (Addendum)
[facility] [address] [place], [place], [address] Phone: [phone]   Fax:  [phone]  Physical Therapy Treatment  Patient Details  Name: Wyatt Joseph MRN: [id number] Date of Birth: 05-30-1965 Referring Provider (PT): Dimas Chyle   Encounter Date: 10/05/2018  PT End of Session - 10/05/18 1143    Visit Number  4    Number of Visits  12    Date for PT Re-Evaluation  11/02/18    Authorization Type  [employer]    PT Start Time  0810    PT Stop Time  0848    PT Time Calculation (min)  38 min    Activity Tolerance  Patient tolerated treatment well    Behavior During Therapy  [facility] for tasks assessed/performed       Past Medical History:  Diagnosis Date  . Hemorrhoids     Past Surgical History:  Procedure Laterality Date  . APPENDECTOMY      There were no vitals filed for this visit.  Subjective Assessment - 10/05/18 1143    Subjective  Pt states pain is better, but still a bit sore at night. Has been doing HEP.    Currently in Pain?  Yes    Pain Score  2     Pain Location  Shoulder    Pain Orientation  Right;Left    Pain Descriptors / Indicators  Aching    Pain Type  Chronic pain    Pain Onset  More than a month ago    Pain Frequency  Intermittent         [facility] PT Assessment - 10/05/18 0001      AROM   Right Shoulder Flexion  155 Degrees    Right Shoulder ABduction  155 Degrees    Left Shoulder Flexion  155 Degrees    Left Shoulder ABduction  150 Degrees      PROM   Right Shoulder Flexion  165 Degrees    Right Shoulder ABduction  165 Degrees    Right Shoulder Internal Rotation  75 Degrees    Right Shoulder External Rotation  80 Degrees    Left Shoulder Flexion  160 Degrees    Left Shoulder ABduction  160 Degrees    Left Shoulder Internal Rotation  65 Degrees    Left Shoulder External Rotation  72 Degrees      Strength   Right Shoulder Flexion  4-/5    Right Shoulder ABduction  4-/5    Right Shoulder  Internal Rotation  4/5    Right Shoulder External Rotation  4/5    Left Shoulder Flexion  4-/5    Left Shoulder ABduction  4-/5    Left Shoulder Internal Rotation  4/5    Left Shoulder External Rotation  4/5                   [facility] Adult PT Treatment/Exercise - 10/05/18 0001      Shoulder Exercises: Supine   Flexion  AROM;20 reps    Flexion Limitations  cane 2 lb// then AROM 2lb x20;     Other Supine Exercises  ER butterfly 20 sec x5;       Shoulder Exercises: Standing   External Rotation  15 reps;Theraband    Theraband Level (Shoulder External Rotation)  Level 2 (Red)    Internal Rotation  15 reps;Theraband    Theraband Level (Shoulder Internal Rotation)  Level 2 (Red)    Flexion  AROM;10 reps  Row  20 reps    Theraband Level (Shoulder Row)  Level 3 (Green)      Shoulder Exercises: Pulleys   Flexion  2 minutes    Scaption  2 minutes      Shoulder Exercises: Stretch   Corner Stretch  3 reps;30 seconds    Corner Stretch Limitations  doorway/ 45 and 90 deg;     Internal Rotation Stretch  5 reps    Internal Rotation Stretch Limitations  hands behind back; x15      Manual Therapy   Manual Therapy  Soft tissue mobilization;Passive ROM;Joint mobilization    Joint Mobilization  For Bil GHJ, inf and post mobs gr 3     Passive ROM  For Bil  GHJ, all motions             PT Education - 10/05/18 1143    Education Details  HEP updated    Person(s) Educated  Patient    Methods  Explanation;Demonstration;Tactile cues;Verbal cues;Handout    Comprehension  Verbalized understanding;Returned demonstration;Verbal cues required;Tactile cues required;Need further instruction          PT Long Term Goals - 10/05/18 1144      PT LONG TERM GOAL #1   Title  Pt to demo full PROM of bil shoulders    Time  6    Period  Weeks    Status  Partially Met      PT LONG TERM GOAL #2   Title  Pt to demo ability for full AROM of bil Shoulders, to improve ability for IADLS.     Time  6    Period  Weeks    Status  Partially Met      PT LONG TERM GOAL #3   Title  Pt to demo increased strength of bil shoulder elevation to be at least 4/5 to improve ability for IADLS, lifting, carrying, and reaching.    Time  6    Period  Weeks    Status  Partially Met      PT LONG TERM GOAL #4   Title  Pt to report decreased pain in bil shoulders to 0-2/10 , to improve sleep and functional activity .    Time  6    Period  Weeks    Status  Partially Met            Plan - 10/05/18 1145    Clinical Impression Statement  Pt with improving PROM , and improving ability for AROM. He is lacking full PROM on L for elevation. Progressed ther ex today, updated HEP with more strengthening. Recommended that pt return for treatment, due to making good progress.. but pt wants to "see how it goes" doing the HEP, and will schedule another appt if needed. Goals all partially met at this time.    Personal Factors and Comorbidities  Time since onset of injury/illness/exacerbation    Examination-Activity Limitations  Reach Overhead;Sleep;Carry;Dressing;Hygiene/Grooming;Lift    Examination-Participation Restrictions  Cleaning;Community Activity    Stability/Clinical Decision Making  Stable/Uncomplicated    Rehab Potential  Good    PT Frequency  2x / week    PT Duration  6 weeks    PT Treatment/Interventions  ADLs/Self Care Home Management;Cryotherapy;Electrical Stimulation;Ultrasound;Moist Heat;Iontophoresis 35m/ml Dexamethasone;Gait training;Functional mobility training;Therapeutic activities;Therapeutic exercise;Balance training;Neuromuscular re-education;Manual techniques;Orthotic Fit/Training;Patient/family education;Passive range of motion;Dry needling;Spinal Manipulations;Joint Manipulations;Taping    Consulted and Agree with Plan of Care  Patient       Patient will benefit from skilled therapeutic intervention  in order to improve the following deficits and impairments:  Decreased  range of motion, Increased muscle spasms, Impaired UE functional use, Pain, Hypomobility, Impaired flexibility, Improper body mechanics, Decreased strength, Decreased mobility  Visit Diagnosis: Chronic left shoulder pain  Chronic right shoulder pain  Stiffness of left shoulder, not elsewhere classified  Stiffness of right shoulder, not elsewhere classified  Muscle weakness (generalized)     Problem List Patient Active Problem List   Diagnosis Date Noted  . Gastroesophageal reflux disease 09/10/2018  . Pain of hand 09/10/2018  . Bilateral shoulder pain 09/10/2018  . Difficulty with CPAP use 12/01/2017  . Mild obstructive sleep apnea 12/01/2017  . BENIGN PROSTATIC HYPERTROPHY, WITH URINARY OBSTRUCTION 07/13/2008     Lyndee Hensen, PT, DPT 11:46 AM  10/05/18   [facility] [place], [place], [address] Phone: [phone]   Fax:  [phone]  Name: Wyatt Joseph MRN: [id number] Date of Birth: 1965-12-01   PHYSICAL THERAPY DISCHARGE SUMMARY  Visits from Start of Care: 4  Plan: Patient agrees to discharge.  Patient goals were partially met. Patient is being discharged due to being pleased with the current functional level.  ?????     Lyndee Hensen, PT, DPT 10:43 AM  02/08/19

## 2019-06-18 ENCOUNTER — Other Ambulatory Visit: Payer: Self-pay

## 2019-06-18 ENCOUNTER — Ambulatory Visit (INDEPENDENT_AMBULATORY_CARE_PROVIDER_SITE_OTHER): Payer: BC Managed Care – PPO | Admitting: Family Medicine

## 2019-06-18 ENCOUNTER — Encounter: Payer: Self-pay | Admitting: Family Medicine

## 2019-06-18 VITALS — BP 120/80 | HR 56 | Temp 95.5°F | Ht 67.0 in | Wt 173.0 lb

## 2019-06-18 DIAGNOSIS — M79643 Pain in unspecified hand: Secondary | ICD-10-CM

## 2019-06-18 DIAGNOSIS — Z13220 Encounter for screening for lipoid disorders: Secondary | ICD-10-CM | POA: Diagnosis not present

## 2019-06-18 DIAGNOSIS — Z0001 Encounter for general adult medical examination with abnormal findings: Secondary | ICD-10-CM | POA: Diagnosis not present

## 2019-06-18 DIAGNOSIS — Z125 Encounter for screening for malignant neoplasm of prostate: Secondary | ICD-10-CM | POA: Diagnosis not present

## 2019-06-18 DIAGNOSIS — Z1211 Encounter for screening for malignant neoplasm of colon: Secondary | ICD-10-CM

## 2019-06-18 DIAGNOSIS — Z6827 Body mass index (BMI) 27.0-27.9, adult: Secondary | ICD-10-CM

## 2019-06-18 DIAGNOSIS — E663 Overweight: Secondary | ICD-10-CM

## 2019-06-18 LAB — CBC
HCT: 47 % (ref 39.0–52.0)
Hemoglobin: 16 g/dL (ref 13.0–17.0)
MCHC: 34 g/dL (ref 30.0–36.0)
MCV: 86.9 fl (ref 78.0–100.0)
Platelets: 212 10*3/uL (ref 150.0–400.0)
RBC: 5.41 Mil/uL (ref 4.22–5.81)
RDW: 12.4 % (ref 11.5–15.5)
WBC: 4.4 10*3/uL (ref 4.0–10.5)

## 2019-06-18 LAB — COMPREHENSIVE METABOLIC PANEL
ALT: 26 U/L (ref 0–53)
AST: 22 U/L (ref 0–37)
Albumin: 4.7 g/dL (ref 3.5–5.2)
Alkaline Phosphatase: 89 U/L (ref 39–117)
BUN: 17 mg/dL (ref 6–23)
CO2: 27 mEq/L (ref 19–32)
Calcium: 9.1 mg/dL (ref 8.4–10.5)
Chloride: 105 mEq/L (ref 96–112)
Creatinine, Ser: 0.82 mg/dL (ref 0.40–1.50)
GFR: 97.58 mL/min (ref >60.00)
Glucose, Bld: 118 mg/dL — ABNORMAL HIGH (ref 70–99)
Potassium: 3.7 mEq/L (ref 3.5–5.1)
Sodium: 138 mEq/L (ref 135–145)
Total Bilirubin: 0.7 mg/dL (ref 0.2–1.2)
Total Protein: 6.8 g/dL (ref 6.0–8.3)

## 2019-06-18 LAB — LIPID PANEL
Cholesterol: 173 mg/dL (ref 0–200)
HDL: 42.3 mg/dL (ref >39.00)
LDL Cholesterol: 110 mg/dL — ABNORMAL HIGH (ref 0–99)
NonHDL: 130.85
Total CHOL/HDL Ratio: 4
Triglycerides: 103 mg/dL (ref 0.0–149.0)
VLDL: 20.6 mg/dL (ref 0.0–40.0)

## 2019-06-18 LAB — TSH: TSH: 0.83 u[IU]/mL (ref 0.35–4.50)

## 2019-06-18 LAB — PSA: PSA: 0.85 ng/mL (ref 0.10–4.00)

## 2019-06-18 NOTE — Patient Instructions (Signed)
It was very nice to see you today!  We will check blood work today.  We will also get you set up to have a Cologuard.  I will see back in 1 year for your next physical with blood work, or sooner if needed.  Take care, Dr Jerline Pain  Please try these tips to maintain a healthy lifestyle:   Eat at least 3 REAL meals and 1-2 snacks per day.  Aim for no more than 5 hours between eating.  If you eat breakfast, please do so within one hour of getting up.    Each meal should contain half fruits/vegetables, one quarter protein, and one quarter carbs (no bigger than a computer mouse)   Cut down on sweet beverages. This includes juice, soda, and sweet tea.     Drink at least 1 glass of water with each meal and aim for at least 8 glasses per day   Exercise at least 150 minutes every week.    Preventive Care 40-63 Years Old, Male Preventive care refers to lifestyle choices and visits with your health care provider that can promote health and wellness. This includes:  A yearly physical exam. This is also called an annual well check.  Regular dental and eye exams.  Immunizations.  Screening for certain conditions.  Healthy lifestyle choices, such as eating a healthy diet, getting regular exercise, not using drugs or products that contain nicotine and tobacco, and limiting alcohol use. What can I expect for my preventive care visit? Physical exam Your health care provider will check:  Height and weight. These may be used to calculate body mass index (BMI), which is a measurement that tells if you are at a healthy weight.  Heart rate and blood pressure.  Your skin for abnormal spots. Counseling Your health care provider may ask you questions about:  Alcohol, tobacco, and drug use.  Emotional well-being.  Home and relationship well-being.  Sexual activity.  Eating habits.  Work and work Statistician. What immunizations do I need?  Influenza (flu) vaccine  This is  recommended every year. Tetanus, diphtheria, and pertussis (Tdap) vaccine  You may need a Td booster every 10 years. Varicella (chickenpox) vaccine  You may need this vaccine if you have not already been vaccinated. Zoster (shingles) vaccine  You may need this after age 55. Measles, mumps, and rubella (MMR) vaccine  You may need at least one dose of MMR if you were born in 1957 or later. You may also need a second dose. Pneumococcal conjugate (PCV13) vaccine  You may need this if you have certain conditions and were not previously vaccinated. Pneumococcal polysaccharide (PPSV23) vaccine  You may need one or two doses if you smoke cigarettes or if you have certain conditions. Meningococcal conjugate (MenACWY) vaccine  You may need this if you have certain conditions. Hepatitis A vaccine  You may need this if you have certain conditions or if you travel or work in places where you may be exposed to hepatitis A. Hepatitis B vaccine  You may need this if you have certain conditions or if you travel or work in places where you may be exposed to hepatitis B. Haemophilus influenzae type b (Hib) vaccine  You may need this if you have certain risk factors. Human papillomavirus (HPV) vaccine  If recommended by your health care provider, you may need three doses over 6 months. You may receive vaccines as individual doses or as more than one vaccine together in one shot (combination vaccines). Talk  with your health care provider about the risks and benefits of combination vaccines. What tests do I need? Blood tests  Lipid and cholesterol levels. These may be checked every 5 years, or more frequently if you are over 83 years old.  Hepatitis C test.  Hepatitis B test. Screening  Lung cancer screening. You may have this screening every year starting at age 63 if you have a 30-pack-year history of smoking and currently smoke or have quit within the past 15 years.  Prostate cancer  screening. Recommendations will vary depending on your family history and other risks.  Colorectal cancer screening. All adults should have this screening starting at age 58 and continuing until age 59. Your health care provider may recommend screening at age 44 if you are at increased risk. You will have tests every 1-10 years, depending on your results and the type of screening test.  Diabetes screening. This is done by checking your blood sugar (glucose) after you have not eaten for a while (fasting). You may have this done every 1-3 years.  Sexually transmitted disease (STD) testing. Follow these instructions at home: Eating and drinking  Eat a diet that includes fresh fruits and vegetables, whole grains, lean protein, and low-fat dairy products.  Take vitamin and mineral supplements as recommended by your health care provider.  Do not drink alcohol if your health care provider tells you not to drink.  If you drink alcohol: ? Limit how much you have to 0-2 drinks a day. ? Be aware of how much alcohol is in your drink. In the [place] one drink equals one 12 oz bottle of beer (355 mL), one 5 oz glass of wine (148 mL), or one 1 oz glass of hard liquor (44 mL). Lifestyle  Take daily care of your teeth and gums.  Stay active. Exercise for at least 30 minutes on 5 or more days each week.  Do not use any products that contain nicotine or tobacco, such as cigarettes, e-cigarettes, and chewing tobacco. If you need help quitting, ask your health care provider.  If you are sexually active, practice safe sex. Use a condom or other form of protection to prevent STIs (sexually transmitted infections).  Talk with your health care provider about taking a low-dose aspirin every day starting at age 91. What's next?  Go to your health care provider once a year for a well check visit.  Ask your health care provider how often you should have your eyes and teeth checked.  Stay up to date on all  vaccines. This information is not intended to replace advice given to you by your health care provider. Make sure you discuss any questions you have with your health care provider. Document Revised: 01/15/2018 Document Reviewed: 01/15/2018 Elsevier Patient Education  2020 [employer].

## 2019-06-18 NOTE — Progress Notes (Signed)
Chief Complaint:  Wyatt Joseph is a 54 y.o. male who presents today for his annual comprehensive physical exam.    Assessment/Plan:  Bilateral hand pain Likely secondary to osteoarthritis.  Recommended over-the-counter Voltaren gel.  Body mass index is 27.1 kg/m. / Overweight BMI Metric Follow Up - 06/18/19 0817      BMI Metric Follow Up-Please document annually   BMI Metric Follow Up  Education provided       Preventative Healthcare: Check CBC, CMET, TSH, lipid panel, and PSA. Check cologuard.   Patient Counseling(The following topics were reviewed and/or handout was given):  -Nutrition: Stressed importance of moderation in sodium/caffeine intake, saturated fat and cholesterol, caloric balance, sufficient intake of fresh fruits, vegetables, and fiber.  -Stressed the importance of regular exercise.   -Substance Abuse: Discussed cessation/primary prevention of tobacco, alcohol, or other drug use; driving or other dangerous activities under the influence; availability of treatment for abuse.   -Injury prevention: Discussed safety belts, safety helmets, smoke detector, smoking near bedding or upholstery.   -Sexuality: Discussed sexually transmitted diseases, partner selection, use of condoms, avoidance of unintended pregnancy and contraceptive alternatives.   -Dental health: Discussed importance of regular tooth brushing, flossing, and dental visits.  -Health maintenance and immunizations reviewed. Please refer to Health maintenance section.  Return to care in 1 year for next preventative visit.     Subjective:  HPI:  He has no acute complaints today.   He has been having bilateral hand pain. Worse with typing.   Lifestyle Diet: Balanced. Plenty of fruits and vegetables.  Exercise: Likes to go on walks on the weekends.   Depression screen PHQ 2/9 09/10/2018  Decreased Interest 0  Down, Depressed, Hopeless 0  PHQ - 2 Score 0   Health Maintenance Due  Topic Date Due  .  HIV Screening  Never done    ROS: Per HPI, otherwise a complete review of systems was negative.   PMH:  The following were reviewed and entered/updated in epic: Past Medical History:  Diagnosis Date  . Hemorrhoids    Patient Active Problem List   Diagnosis Date Noted  . Gastroesophageal reflux disease 09/10/2018  . Pain of hand 09/10/2018  . Bilateral shoulder pain 09/10/2018  . Difficulty with CPAP use 12/01/2017  . Mild obstructive sleep apnea 12/01/2017  . BENIGN PROSTATIC HYPERTROPHY, WITH URINARY OBSTRUCTION 07/13/2008   Past Surgical History:  Procedure Laterality Date  . APPENDECTOMY      Family History  Problem Relation Age of Onset  . Cancer Neg Hx     Medications- reviewed and updated Current Outpatient Medications  Medication Sig Dispense Refill  . cimetidine (TAGAMET) 400 MG tablet Take 1 tablet (400 mg total) by mouth 2 (two) times daily as needed. (Patient not taking: Reported on 06/18/2019) 60 tablet 11   No current facility-administered medications for this visit.    Allergies-reviewed and updated No Known Allergies  Social History   Socioeconomic History  . Marital status: Married    Spouse name: Not on file  . Number of children: 2  . Years of education: Not on file  . Highest education level: Not on file  Occupational History  . Not on file  Tobacco Use  . Smoking status: Never Smoker  . Smokeless tobacco: Never Used  . Tobacco comment: Regular Exercise - Yes  Substance and Sexual Activity  . Alcohol use: No  . Drug use: No  . Sexual activity: Yes    Birth control/protection: Condom  Other  Topics Concern  . Not on file  Social History Narrative  . Not on file   Social Determinants of Health   Financial Resource Strain:   . Difficulty of Paying Living Expenses:   Food Insecurity:   . Worried About Programme researcher, broadcasting/film/video in the Last Year:   . Barista in the Last Year:   Transportation Needs:   . Freight forwarder  (Medical):   Marland Kitchen Lack of Transportation (Non-Medical):   Physical Activity:   . Days of Exercise per Week:   . Minutes of Exercise per Session:   Stress:   . Feeling of Stress :   Social Connections:   . Frequency of Communication with Friends and Family:   . Frequency of Social Gatherings with Friends and Family:   . Attends Religious Services:   . Active Member of Clubs or Organizations:   . Attends Banker Meetings:   Marland Kitchen Marital Status:         Objective:  Physical Exam: BP 120/80 (BP Location: Left Arm, Patient Position: Sitting, Cuff Size: Normal)   Pulse (!) 56   Temp (!) 95.5 F (35.3 C) (Temporal)   Ht 5\' 7"  (1.702 m)   Wt 173 lb (78.5 kg)   SpO2 98%   BMI 27.10 kg/m   Body mass index is 27.1 kg/m. Wt Readings from Last 3 Encounters:  06/18/19 173 lb (78.5 kg)  09/10/18 171 lb 12 oz (77.9 kg)  12/01/17 174 lb (78.9 kg)   Gen: NAD, resting comfortably HEENT: TMs normal bilaterally. OP clear. No thyromegaly noted.  CV: RRR with no murmurs appreciated Pulm: NWOB, CTAB with no crackles, wheezes, or rhonchi GI: Normal bowel sounds present. Soft, Nontender, Nondistended. MSK: no edema, cyanosis, or clubbing noted Skin: warm, dry Neuro: CN2-12 grossly intact. Strength 5/5 in upper and lower extremities. Reflexes symmetric and intact bilaterally.  Psych: Normal affect and thought content     Ameah Chanda M. 12/03/17, MD 06/18/2019 8:47 AM

## 2019-06-21 ENCOUNTER — Encounter: Payer: Self-pay | Admitting: Family Medicine

## 2019-06-21 ENCOUNTER — Telehealth: Payer: Self-pay | Admitting: Family Medicine

## 2019-06-21 DIAGNOSIS — E785 Hyperlipidemia, unspecified: Secondary | ICD-10-CM | POA: Insufficient documentation

## 2019-06-21 DIAGNOSIS — R739 Hyperglycemia, unspecified: Secondary | ICD-10-CM | POA: Insufficient documentation

## 2019-06-21 NOTE — Progress Notes (Signed)
Please inform patient of the following:  Blood sugar and cholesterol are both borderline but everything else is NORMAL. Do not need to make any changes to his treatment plan at this time. Recommend he continue to work on diet and exercise and we can recheck in a year or so.  Wyatt Joseph. [name], MD 06/21/2019 8:03 AM

## 2019-06-21 NOTE — Telephone Encounter (Signed)
Lab results given

## 2019-06-21 NOTE — Telephone Encounter (Signed)
Patient is calling back to discuss lab results.

## 2019-12-22 ENCOUNTER — Telehealth: Payer: Self-pay

## 2019-12-22 DIAGNOSIS — Z1211 Encounter for screening for malignant neoplasm of colon: Secondary | ICD-10-CM

## 2019-12-22 NOTE — Telephone Encounter (Signed)
I have ordered the cologuard Francena Hanly, can you fax it off please?

## 2019-12-22 NOTE — Telephone Encounter (Signed)
Cologuard order faxed.

## 2019-12-22 NOTE — Telephone Encounter (Signed)
Pt states he was supposed to receive a cologuard kit months ago and still hasn't received one.

## 2020-01-19 LAB — COLOGUARD: Cologuard: NEGATIVE

## 2020-01-20 ENCOUNTER — Encounter: Payer: Self-pay | Admitting: Family Medicine

## 2020-09-28 ENCOUNTER — Encounter (HOSPITAL_BASED_OUTPATIENT_CLINIC_OR_DEPARTMENT_OTHER): Payer: Self-pay | Admitting: Emergency Medicine

## 2020-09-28 ENCOUNTER — Emergency Department (HOSPITAL_BASED_OUTPATIENT_CLINIC_OR_DEPARTMENT_OTHER)
Admission: EM | Admit: 2020-09-28 | Discharge: 2020-09-28 | Disposition: A | Discharge status: Home / Self Care | Payer: BC Managed Care – PPO | Attending: Emergency Medicine | Admitting: Emergency Medicine

## 2020-09-28 ENCOUNTER — Other Ambulatory Visit: Payer: Self-pay

## 2020-09-28 ENCOUNTER — Emergency Department (HOSPITAL_BASED_OUTPATIENT_CLINIC_OR_DEPARTMENT_OTHER): Payer: BC Managed Care – PPO

## 2020-09-28 ENCOUNTER — Ambulatory Visit: Payer: Self-pay | Admitting: Family Medicine

## 2020-09-28 DIAGNOSIS — M25512 Pain in left shoulder: Secondary | ICD-10-CM | POA: Insufficient documentation

## 2020-09-28 DIAGNOSIS — Z791 Long term (current) use of non-steroidal anti-inflammatories (NSAID): Secondary | ICD-10-CM | POA: Diagnosis not present

## 2020-09-28 DIAGNOSIS — M7918 Myalgia, other site: Secondary | ICD-10-CM

## 2020-09-28 DIAGNOSIS — M546 Pain in thoracic spine: Secondary | ICD-10-CM | POA: Diagnosis present

## 2020-09-28 LAB — CBC WITH DIFFERENTIAL/PLATELET
Abs Immature Granulocytes: 0.01 10*3/uL (ref 0.00–0.07)
Basophils Absolute: 0 10*3/uL (ref 0.0–0.1)
Basophils Relative: 1 %
Eosinophils Absolute: 0 10*3/uL (ref 0.0–0.5)
Eosinophils Relative: 1 %
HCT: 50.3 % (ref 39.0–52.0)
Hemoglobin: 17 g/dL (ref 13.0–17.0)
Immature Granulocytes: 0 %
Lymphocytes Relative: 24 %
Lymphs Abs: 1.5 10*3/uL (ref 0.7–4.0)
MCH: 29 pg (ref 26.0–34.0)
MCHC: 33.8 g/dL (ref 30.0–36.0)
MCV: 85.7 fL (ref 80.0–100.0)
Monocytes Absolute: 0.4 10*3/uL (ref 0.1–1.0)
Monocytes Relative: 7 %
Neutro Abs: 4.2 10*3/uL (ref 1.7–7.7)
Neutrophils Relative %: 67 %
Platelets: 242 10*3/uL (ref 150–400)
RBC: 5.87 MIL/uL — ABNORMAL HIGH (ref 4.22–5.81)
RDW: 11.7 % (ref 11.5–15.5)
WBC: 6.2 10*3/uL (ref 4.0–10.5)
nRBC: 0 % (ref 0.0–0.2)

## 2020-09-28 LAB — BASIC METABOLIC PANEL
Anion gap: 11 (ref 5–15)
BUN: 10 mg/dL (ref 6–20)
CO2: 26 mmol/L (ref 22–32)
Calcium: 9.9 mg/dL (ref 8.9–10.3)
Chloride: 103 mmol/L (ref 98–111)
Creatinine, Ser: 0.81 mg/dL (ref 0.61–1.24)
GFR, Estimated: >60 mL/min (ref >60)
Glucose, Bld: 147 mg/dL — ABNORMAL HIGH (ref 70–99)
Potassium: 3.5 mmol/L (ref 3.5–5.1)
Sodium: 140 mmol/L (ref 135–145)

## 2020-09-28 LAB — MAGNESIUM: Magnesium: 2.2 mg/dL (ref 1.7–2.4)

## 2020-09-28 LAB — BRAIN NATRIURETIC PEPTIDE: B Natriuretic Peptide: 21 pg/mL (ref 0.0–100.0)

## 2020-09-28 LAB — TROPONIN I (HIGH SENSITIVITY): Troponin I (High Sensitivity): 6 ng/L (ref <18)

## 2020-09-28 MED ORDER — IBUPROFEN 600 MG PO TABS: 600.0000 mg | ORAL_TABLET | Freq: Four times a day (QID) | ORAL | 0 refills | Status: DC | PRN

## 2020-09-28 MED ORDER — IBUPROFEN 400 MG PO TABS
600.0000 mg | ORAL_TABLET | Freq: Once | ORAL | Status: AC
  Administered 2020-09-28: 600 mg via ORAL
  Filled 2020-09-28: qty 1

## 2020-09-28 NOTE — Discharge Instructions (Addendum)
-  Light stretching exercises -Decrease heaving lifting x 1 week -motrin and robaxin for pain

## 2020-09-28 NOTE — ED Provider Notes (Signed)
[facility] EMERGENCY DEPT Provider Note   CSN: [id number] Arrival date & time: 09/28/20  1546     History Chief Complaint  Patient presents with   Shoulder Pain    Wyatt Joseph is a 55 y.o. male.  Patient is a 55 yo male presenting for back pain. Patient admits to left sided upper back pain between the shoulder blade and spine. States pain has been constant, dull, and worse with movement for the past week. States it was associated with elevated blood pressure readings-notified by his iphone watch reading >190/. Patient presenting to make sure heart and lungs are safe. Denies chest pain or sob. Denies hx of DVT/PE, prolonged sitting, recent immobilization or surgeries, or hormone use.   The history is provided by the patient. No language interpreter was used.  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 Authorizing Provider  cimetidine  (TAGAMET) 400 MG tablet Take 1 tablet (400 mg total) by mouth 2 (two) times daily as needed. Patient not taking: Reported on 06/18/2019 09/10/18   Ardith Dark, MD    Allergies    Patient has no known allergies.  Review of Systems   Review of Systems  Constitutional:  Negative for chills and fever.  HENT:  Negative for ear pain and sore throat.   Eyes:  Negative for pain and visual disturbance.  Respiratory:  Negative for cough and shortness of breath.   Cardiovascular:  Negative for chest pain and palpitations.  Gastrointestinal:  Negative for abdominal pain and vomiting.  Genitourinary:  Negative for dysuria and hematuria.  Musculoskeletal:  Positive for back pain (medial to left shoulder blade). Negative for arthralgias.  Skin:  Negative for color change and rash.  Neurological:  Negative for seizures and syncope.  All other systems reviewed and are negative.  Physical Exam Updated Vital Signs BP (!) 159/105   Pulse 84   Temp 98.3 F (36.8 C) (Oral)   Resp 16   Ht 5\' 7"  (1.702 m)   Wt 77.1 kg   SpO2 99%   BMI 26.63 kg/m   Physical Exam Vitals and nursing note reviewed.  Constitutional:      Appearance: He is well-developed.  HENT:     Head: Normocephalic and atraumatic.  Eyes:     Conjunctiva/sclera: Conjunctivae normal.  Cardiovascular:  Rate and Rhythm: Normal rate and regular rhythm.     Heart sounds: No murmur heard. Pulmonary:     Effort: Pulmonary effort is normal. No respiratory distress.     Breath sounds: Normal breath sounds.  Abdominal:     Palpations: Abdomen is soft.     Tenderness: There is no abdominal tenderness.  Musculoskeletal:       Arms:     Cervical back: Neck supple.  Skin:    General: Skin is warm and dry.  Neurological:     Mental Status: He is alert.    ED Results / Procedures / Treatments   Labs (all labs ordered are listed, but only abnormal results are displayed) Labs Reviewed  CBC WITH DIFFERENTIAL/PLATELET -  Abnormal; Notable for the following components:      Result Value   RBC 5.87 (*)    All other components within normal limits  BASIC METABOLIC PANEL - Abnormal; Notable for the following components:   Glucose, Bld 147 (*)    All other components within normal limits  MAGNESIUM  BRAIN NATRIURETIC PEPTIDE  TROPONIN I (HIGH SENSITIVITY)    EKG None  Radiology DG Chest Portable 1 View  Result Date: 09/28/2020 CLINICAL DATA:  Left rib pain.  Chest pain.  Left shoulder pain. EXAM: PORTABLE CHEST 1 VIEW COMPARISON:  None. FINDINGS: Upper normal heart size likely accentuated by AP technique. Normal mediastinal contours. Mild elevation of right hemidiaphragm. Incidental azygos fissure. Mild interstitial coarsening of unknown acuity. No focal airspace disease, pleural effusion, or pneumothorax. No acute osseous abnormalities are seen. No focal left rib abnormality on this portable AP view. IMPRESSION: 1. Mild interstitial coarsening of unknown acuity, but favored to be chronic. 2. No explanation for left-sided pain or focal left rib abnormality on this portable AP view. Electronically Signed   By: Narda Rutherford M.D.   On: 09/28/2020 16:58    Procedures Procedures   Medications Ordered in ED Medications - No data to display  ED Course  I have reviewed the triage vital signs and the nursing notes.  Pertinent labs & imaging results that were available during my care of the patient were reviewed by me and considered in my medical decision making (see chart for details).    MDM Rules/Calculators/A&P                           5:36 PM 55 yo male presenting for back pain. Patient is Aox3, no acute distress, afebrile, with stable vitals. Physical exam demonstrates reproducible tenderness to palpation. No ecchymosis, lacerations, abrasions, or swelling.  Stable Ekg with no STEMI/NSTEMI. Stable rate. Stable intervals. Troponin, bnp, electrolytes, and cxr stable. Pt PERC negative. Low suspicion  PE.  Patient in no distress and overall condition improved here in the ED. Pain likely musculoskeletal in nature at rhomboid muscles. Pt has robaxin at home as prescribed by orthopedic surgeon that he saw prior to ED visit for same pain. Has not started taking yet. Recommended to take with motrin. Detailed discussions were had with the patient regarding current findings, and need for close f/u with PCP or on call doctor. The patient has been instructed to return immediately if the symptoms worsen in any way for re-evaluation. Patient verbalized understanding and is in agreement with current care plan. All questions answered prior to discharge.     Final Clinical Impression(s) / ED Diagnoses Final diagnoses:  Musculoskeletal pain  Rhomboid muscle pain  Rx / DC Orders ED Discharge Orders     None        Franne Forts, DO 09/28/20 1740

## 2020-09-28 NOTE — ED Notes (Signed)
Pt dc  Home ambulatory. Pt states understanding of dc instructions.

## 2020-09-28 NOTE — ED Triage Notes (Signed)
Pt presents to ED POV. Pt c/o L shoulder pain. Pt reports that pain began last week and describes as dull. Pt reports that pain is now radiating to side

## 2020-10-10 ENCOUNTER — Other Ambulatory Visit: Payer: Self-pay

## 2020-10-10 ENCOUNTER — Ambulatory Visit (INDEPENDENT_AMBULATORY_CARE_PROVIDER_SITE_OTHER): Payer: BC Managed Care – PPO | Admitting: Family Medicine

## 2020-10-10 ENCOUNTER — Encounter: Payer: Self-pay | Admitting: Family Medicine

## 2020-10-10 VITALS — BP 169/82 | HR 93 | Temp 98.0°F | Ht 67.0 in | Wt 175.6 lb

## 2020-10-10 DIAGNOSIS — M549 Dorsalgia, unspecified: Secondary | ICD-10-CM

## 2020-10-10 DIAGNOSIS — R030 Elevated blood-pressure reading, without diagnosis of hypertension: Secondary | ICD-10-CM

## 2020-10-10 NOTE — Patient Instructions (Signed)
It was very nice to see you today!   Please work on the exercises for your back. I think the pain is causing your blood pressure to go up.  I do not think we need to get you on any blood pressure medications at this point.  Please let me know if your blood pressure is persistently elevated over the next couple of weeks.   Take care, Dr [name]  PLEASE NOTE:  If you had any lab tests please let us know if you have not heard back within a few days. You may see your results on mychart before we have a chance to review them but we will give you a call once they are reviewed by [place]. If we ordered any referrals today, please let us know if you have not heard from their office within the next week.   Please try these tips to maintain a healthy lifestyle:  Eat at least 3 REAL meals and 1-2 snacks per day.  Aim for no more than 5 hours between eating.  If you eat breakfast, please do so within one hour of getting up.   Each meal should contain half fruits/vegetables, one quarter protein, and one quarter carbs (no bigger than a computer mouse)  Cut down on sweet beverages. This includes juice, soda, and sweet tea.   Drink at least 1 glass of water with each meal and aim for at least 8 glasses per day  Exercise at least 150 minutes every week.

## 2020-10-10 NOTE — Progress Notes (Signed)
   Wyatt Joseph is a 55 y.o. male who presents today for an office visit.  Assessment/Plan:  New/Acute Problems: Thoracic Back Pain Consistent with muscular strain - likely rhomboid. Home exercise program was given. He will continue his muscle relaxer and follow up with orthopedics later this week. Reassured patient that his pain was not due to blood pressure.  Elevated BP reading Elevated in setting of acute pain. Has previously been very well controlled. We will avoid starting antihypertensives today. Hopefully will improve over the next couple of weeks as his pain above is being treated. If he has persistently elevated BP reading despite resolution of above will start amlodipine or other antihypertensive.      Subjective:  HPI:  He is here to discuss about high blood pressure and left upper back pain.  Upper back pain started about 1-2 weeks ago. Went to orthopedics. Was told it was a muscular issue. Started on muscle relaxer. Later that day he took his BP at home and it was significantly elevated. Ended up going to the ED. Had cardiac work up which was negative.   He complains of back pain between the shoulder blade and spine which goes around.He states pain could be associated with elevated blood pressure. He states shoulder pain is better. Denies chest pain or SOB.        Objective:  Physical Exam: BP (!) 169/82   Pulse 93   Temp 98 F (36.7 C) (Temporal)   Ht 5\' 7"  (1.702 m)   Wt 175 lb 9.6 oz (79.7 kg)   SpO2 93%   BMI 27.50 kg/m   Gen: No acute distress, resting comfortably CV: Regular rate and rhythm with no murmurs appreciated Pulm: Normal work of breathing, clear to auscultation bilaterally with no crackles, wheezes, or rhonchi MSK: Tenderness to palpation on left thoracic paraspinal muscle group.  Neuro: Grossly normal, moves all extremities Psych: Normal affect and thought content       I,Savera Zaman,acting as a scribe for , MD.,have  documented all relevant documentation on the behalf of [name], MD,as directed by  [name], MD while in the presence of [name], MD.   I, [name], MD, have reviewed all documentation for this visit. The documentation on 10/10/20 for the exam, diagnosis, procedures, and orders are all accurate and complete.  Time Spent: 30 minutes of total time was spent on the date of the encounter performing the following actions: chart review prior to seeing the patient including recent ED visit, obtaining history, performing a medically necessary exam, counseling on the treatment plan, placing orders, and documenting in our EHR.    12/10/20. Katina Degree, MD 10/10/2020 12:20 PM

## 2020-12-18 ENCOUNTER — Telehealth (INDEPENDENT_AMBULATORY_CARE_PROVIDER_SITE_OTHER): Payer: BC Managed Care – PPO | Admitting: Family Medicine

## 2020-12-18 ENCOUNTER — Encounter: Payer: Self-pay | Admitting: Family Medicine

## 2020-12-18 DIAGNOSIS — U071 COVID-19: Secondary | ICD-10-CM

## 2020-12-18 MED ORDER — NIRMATRELVIR/RITONAVIR (PAXLOVID)TABLET: 3.0000 | ORAL_TABLET | Freq: Two times a day (BID) | ORAL | 0 refills | Status: AC

## 2020-12-18 NOTE — Progress Notes (Signed)
   Wyatt Joseph is a 55 y.o. male who presents today for a telephone visit.  Assessment/Plan:  COVID Moderate risk for increased complications from COVID.  We discussed risk benefits of antiviral therapy.  He wished to proceed.  We will start paxlovid.  He can continue over-the-counter meds. Discussed reasons to return to care.     Subjective:  HPI:  Patient here with covid. Symptoms started 3 days ago. Took a home test which was covid. Went to [employer] and had a pcr test which positive. Symptoms include sore throat, body aches, and fever.  Also has had intermittent cough. Some sputum production. No known sick contacts. Wife has also been sick with covid. Tried several OTC meds and supplements.       Objective/Observations   NAD  Telephone Visit   I connected with Wyatt Joseph on 12/18/20 at  2:00 PM EST via telephone and verified that I am speaking with the correct person using two identifiers. I discussed the limitations of evaluation and management by telemedicine and the availability of in person appointments. The patient expressed understanding and agreed to proceed.   Patient location: Home Provider location: [place] [facility] Persons participating in the virtual visit: Myself and Patient      Wyatt Joseph. Wyatt Ralph, MD 12/18/2020 1:59 PM

## 2021-03-20 ENCOUNTER — Ambulatory Visit (INDEPENDENT_AMBULATORY_CARE_PROVIDER_SITE_OTHER): Payer: BC Managed Care – PPO | Admitting: Family Medicine

## 2021-03-20 ENCOUNTER — Other Ambulatory Visit: Payer: Self-pay

## 2021-03-20 ENCOUNTER — Encounter: Payer: Self-pay | Admitting: Family Medicine

## 2021-03-20 VITALS — BP 160/85 | HR 67 | Temp 98.1°F | Ht 67.0 in | Wt 165.0 lb

## 2021-03-20 DIAGNOSIS — E785 Hyperlipidemia, unspecified: Secondary | ICD-10-CM | POA: Diagnosis not present

## 2021-03-20 DIAGNOSIS — Z0001 Encounter for general adult medical examination with abnormal findings: Secondary | ICD-10-CM | POA: Diagnosis not present

## 2021-03-20 DIAGNOSIS — Z1211 Encounter for screening for malignant neoplasm of colon: Secondary | ICD-10-CM

## 2021-03-20 DIAGNOSIS — Z125 Encounter for screening for malignant neoplasm of prostate: Secondary | ICD-10-CM | POA: Diagnosis not present

## 2021-03-20 DIAGNOSIS — H9193 Unspecified hearing loss, bilateral: Secondary | ICD-10-CM

## 2021-03-20 DIAGNOSIS — R739 Hyperglycemia, unspecified: Secondary | ICD-10-CM

## 2021-03-20 LAB — CBC
HCT: 46 % (ref 39.0–52.0)
Hemoglobin: 15.4 g/dL (ref 13.0–17.0)
MCHC: 33.4 g/dL (ref 30.0–36.0)
MCV: 87.2 fl (ref 78.0–100.0)
Platelets: 209 10*3/uL (ref 150.0–400.0)
RBC: 5.27 Mil/uL (ref 4.22–5.81)
RDW: 12.9 % (ref 11.5–15.5)
WBC: 3.7 10*3/uL — ABNORMAL LOW (ref 4.0–10.5)

## 2021-03-20 LAB — TSH: TSH: 0.55 u[IU]/mL (ref 0.35–5.50)

## 2021-03-20 LAB — PSA: PSA: 1.07 ng/mL (ref 0.10–4.00)

## 2021-03-20 LAB — HEMOGLOBIN A1C: Hgb A1c MFr Bld: 5.1 % (ref 4.6–6.5)

## 2021-03-20 NOTE — Assessment & Plan Note (Signed)
Check A1c. 

## 2021-03-20 NOTE — Progress Notes (Signed)
Chief Complaint:  Wyatt Joseph is a 56 y.o. male who presents today for his annual comprehensive physical exam.    Assessment/Plan:  New/Acute Problems: Elevated BP Reading He has been typically well controlled.  He may have some element of whitecoat hypertension.  Elevated 160/85 today.  We discussed home monitoring and he will continue to monitor at home for the next several weeks.  He will send me a MyChart message and let me know how blood pressure is looking.  If he is averaging over 140/90 at home we will need to start antihypertensive.  Check labs today.  No signs of endorgan damage.  Decreased Hearing Will refer to audiology.   Chronic Problems Addressed Today: Dyslipidemia Check lipids.  Hyperglycemia Check A1c.  Preventative Healthcare: Will get blood work done today. Scheduled for colonoscopy. Deferred flu vaccine. UTD on other vaccines and screenings.  Patient Counseling(The following topics were reviewed and/or handout was given):  -Nutrition: Stressed importance of moderation in sodium/caffeine intake, saturated fat and cholesterol, caloric balance, sufficient intake of fresh fruits, vegetables, and fiber.  -Stressed the importance of regular exercise.   -Substance Abuse: Discussed cessation/primary prevention of tobacco, alcohol, or other drug use; driving or other dangerous activities under the influence; availability of treatment for abuse.   -Injury prevention: Discussed safety belts, safety helmets, smoke detector, smoking near bedding or upholstery.   -Sexuality: Discussed sexually transmitted diseases, partner selection, use of condoms, avoidance of unintended pregnancy and contraceptive alternatives.   -Dental health: Discussed importance of regular tooth brushing, flossing, and dental visits.  -Health maintenance and immunizations reviewed. Please refer to Health maintenance section.  Return to care in 1 year for next preventative visit.     Subjective:   HPI:  He has no acute complaints today.   Patient here with concerned for high blood pressure. He notes over the last few weeks he has noticed elevated blood pressure. He is doing well and has no symptoms. He notes his blood pressure at home usually 140/70 or 142/72. Occasionally have some high reading. He get anxious whenever he take his blood pressure reading and thinks this could be contributing. He follows healthy diet and exercise. Heart rate usually in the 40's. No family hx of hypertension. No reported fever or chills.  He still have some back pain. Had similar issue in the last visit. At that time, his pain was located between the shoulder blade and spine. He notes pain is now located more on the lower back. He has been doing exercises such as stretching. He does feel some discomfort in the back He thinks pain has been improving. No tingling, numbness or weakness.   Lifestyle Diet: None specific Exercise: None specific.  Depression screen PHQ 2/9 12/18/2020  Decreased Interest 0  Down, Depressed, Hopeless 0  PHQ - 2 Score 0    Health Maintenance Due  Topic Date Due   Zoster Vaccines- Shingrix (1 of 2) Never done     ROS: Per HPI, otherwise a complete review of systems was negative.   PMH:  The following were reviewed and entered/updated in epic: Past Medical History:  Diagnosis Date   Hemorrhoids    Patient Active Problem List   Diagnosis Date Noted   Hyperglycemia 06/21/2019   Dyslipidemia 06/21/2019   Gastroesophageal reflux disease 09/10/2018   Pain of hand 09/10/2018   Bilateral shoulder pain 09/10/2018   Difficulty with CPAP use 12/01/2017   Mild obstructive sleep apnea 12/01/2017   BENIGN PROSTATIC HYPERTROPHY, WITH URINARY  OBSTRUCTION 07/13/2008   Past Surgical History:  Procedure Laterality Date   APPENDECTOMY      Family History  Problem Relation Age of Onset   Cancer Neg Hx     Medications- reviewed and updated Current Outpatient Medications   Medication Sig Dispense Refill   aspirin 81 MG EC tablet Take by mouth.     ibuprofen (ADVIL) 600 MG tablet Take 1 tablet (600 mg total) by mouth every 6 (six) hours as needed. 30 tablet 0   Multiple Vitamin (MULTI-VITAMIN) tablet Take 1 tablet by mouth daily.     No current facility-administered medications for this visit.    Allergies-reviewed and updated No Known Allergies  Social History   Socioeconomic History   Marital status: Married    Spouse name: Not on file   Number of children: 2   Years of education: Not on file   Highest education level: Not on file  Occupational History   Not on file  Tobacco Use   Smoking status: Never   Smokeless tobacco: Never   Tobacco comments:    Regular Exercise - Yes  Substance and Sexual Activity   Alcohol use: No   Drug use: No   Sexual activity: Yes    Birth control/protection: Condom  Other Topics Concern   Not on file  Social History Narrative   Not on file   Social Determinants of Health   Financial Resource Strain: Not on file  Food Insecurity: Not on file  Transportation Needs: Not on file  Physical Activity: Not on file  Stress: Not on file  Social Connections: Not on file        Objective:  Physical Exam: BP (!) 160/85    Pulse 67    Temp 98.1 F (36.7 C) (Temporal)    Ht 5\' 7"  (1.702 m)    Wt 165 lb (74.8 kg)    SpO2 100%    BMI 25.84 kg/m   Body mass index is 25.84 kg/m. Wt Readings from Last 3 Encounters:  03/20/21 165 lb (74.8 kg)  10/10/20 175 lb 9.6 oz (79.7 kg)  09/28/20 170 lb (77.1 kg)   Gen: NAD, resting comfortably HEENT: TMs normal bilaterally. OP clear. No thyromegaly noted.  CV: RRR with no murmurs appreciated Pulm: NWOB, CTAB with no crackles, wheezes, or rhonchi GI: Normal bowel sounds present. Soft, Nontender, Nondistended. MSK: no edema, cyanosis, or clubbing noted Skin: warm, dry Neuro: CN2-12 grossly intact. Strength 5/5 in upper and lower extremities. Reflexes symmetric and  intact bilaterally.  Psych: Normal affect and thought content      I,Savera Zaman,acting as a scribe for 09/30/20, MD.,have documented all relevant documentation on the behalf of [name], MD,as directed by  [name], MD while in the presence of [name], MD.   I, [name], MD, have reviewed all documentation for this visit. The documentation on 03/20/21 for the exam, diagnosis, procedures, and orders are all accurate and complete.  03/22/21. Katina Degree, MD 03/20/2021 9:17 AM

## 2021-03-20 NOTE — Assessment & Plan Note (Signed)
Check lipids 

## 2021-03-20 NOTE — Patient Instructions (Signed)
It was very nice to see you today!  Keep an eye on your blood pressure and let me know if it is persistently elevated to 140/90 or higher.  We will check blood work today.  I will refer you for colonoscopy.  Please send me message in a few weeks to let me know how you are blood pressures are looking.  Come back in 1 year for your next physical.  Come back sooner if needed.  Take care, Dr [name]  PLEASE NOTE:  If you had any lab tests please let us know if you have not heard back within a few days. You may see your results on mychart before we have a chance to review them but we will give you a call once they are reviewed by [place]. If we ordered any referrals today, please let us know if you have not heard from their office within the next week.   Please try these tips to maintain a healthy lifestyle:  Eat at least 3 REAL meals and 1-2 snacks per day.  Aim for no more than 5 hours between eating.  If you eat breakfast, please do so within one hour of getting up.   Each meal should contain half fruits/vegetables, one quarter protein, and one quarter carbs (no bigger than a computer mouse)  Cut down on sweet beverages. This includes juice, soda, and sweet tea.   Drink at least 1 glass of water with each meal and aim for at least 8 glasses per day  Exercise at least 150 minutes every week.    Preventive Care 49-45 Years Old, Male Preventive care refers to lifestyle choices and visits with your health care provider that can promote health and wellness. Preventive care visits are also called wellness exams. What can I expect for my preventive care visit? Counseling During your preventive care visit, your health care provider may ask about your: Medical history, including: Past medical problems. Family medical history. Current health, including: Emotional well-being. Home life and relationship well-being. Sexual activity. Lifestyle, including: Alcohol, nicotine or tobacco, and drug  use. Access to firearms. Diet, exercise, and sleep habits. Safety issues such as seatbelt and bike helmet use. Sunscreen use. Work and work Astronomer. Physical exam Your health care provider will check your: Height and weight. These may be used to calculate your BMI (body mass index). BMI is a measurement that tells if you are at a healthy weight. Waist circumference. This measures the distance around your waistline. This measurement also tells if you are at a healthy weight and may help predict your risk of certain diseases, such as type 2 diabetes and high blood pressure. Heart rate and blood pressure. Body temperature. Skin for abnormal spots. What immunizations do I need? Vaccines are usually given at various ages, according to a schedule. Your health care provider will recommend vaccines for you based on your age, medical history, and lifestyle or other factors, such as travel or where you work. What tests do I need? Screening Your health care provider may recommend screening tests for certain conditions. This may include: Lipid and cholesterol levels. Diabetes screening. This is done by checking your blood sugar (glucose) after you have not eaten for a while (fasting). Hepatitis B test. Hepatitis C test. HIV (human immunodeficiency virus) test. STI (sexually transmitted infection) testing, if you are at risk. Lung cancer screening. Prostate cancer screening. Colorectal cancer screening. Talk with your health care provider about your test results, treatment options, and if necessary, the need  for more tests. Follow these instructions at home: Eating and drinking  Eat a diet that includes fresh fruits and vegetables, whole grains, lean protein, and low-fat dairy products. Take vitamin and mineral supplements as recommended by your health care provider. Do not drink alcohol if your health care provider tells you not to drink. If you drink alcohol: Limit how much you have to  0-2 drinks a day. Know how much alcohol is in your drink. In the [place] one drink equals one 12 oz bottle of beer (355 mL), one 5 oz glass of wine (148 mL), or one 1 oz glass of hard liquor (44 mL). Lifestyle Brush your teeth every morning and night with fluoride toothpaste. Floss one time each day. Exercise for at least 30 minutes 5 or more days each week. Do not use any products that contain nicotine or tobacco. These products include cigarettes, chewing tobacco, and vaping devices, such as e-cigarettes. If you need help quitting, ask your health care provider. Do not use drugs. If you are sexually active, practice safe sex. Use a condom or other form of protection to prevent STIs. Take aspirin only as told by your health care provider. Make sure that you understand how much to take and what form to take. Work with your health care provider to find out whether it is safe and beneficial for you to take aspirin daily. Find healthy ways to manage stress, such as: Meditation, yoga, or listening to music. Journaling. Talking to a trusted person. Spending time with friends and family. Minimize exposure to UV radiation to reduce your risk of skin cancer. Safety Always wear your seat belt while driving or riding in a vehicle. Do not drive: If you have been drinking alcohol. Do not ride with someone who has been drinking. When you are tired or distracted. While texting. If you have been using any mind-altering substances or drugs. Wear a helmet and other protective equipment during sports activities. If you have firearms in your house, make sure you follow all gun safety procedures. What's next? Go to your health care provider once a year for an annual wellness visit. Ask your health care provider how often you should have your eyes and teeth checked. Stay up to date on all vaccines. This information is not intended to replace advice given to you by your health care provider. Make sure you  discuss any questions you have with your health care provider. Document Revised: 07/19/2020 Document Reviewed: 07/19/2020 Elsevier Patient Education  2022 [employer].

## 2021-03-21 LAB — COMPREHENSIVE METABOLIC PANEL
ALT: 12 U/L (ref 0–53)
AST: 19 U/L (ref 0–37)
Albumin: 4.7 g/dL (ref 3.5–5.2)
Alkaline Phosphatase: 98 U/L (ref 39–117)
BUN: 9 mg/dL (ref 6–23)
CO2: 28 mEq/L (ref 19–32)
Calcium: 9.7 mg/dL (ref 8.4–10.5)
Chloride: 103 mEq/L (ref 96–112)
Creatinine, Ser: 0.9 mg/dL (ref 0.40–1.50)
GFR: 95.37 mL/min (ref >60.00)
Glucose, Bld: 90 mg/dL (ref 70–99)
Potassium: 4 mEq/L (ref 3.5–5.1)
Sodium: 140 mEq/L (ref 135–145)
Total Bilirubin: 1 mg/dL (ref 0.2–1.2)
Total Protein: 6.8 g/dL (ref 6.0–8.3)

## 2021-03-21 LAB — LIPID PANEL
Cholesterol: 152 mg/dL (ref 0–200)
HDL: 51.1 mg/dL (ref >39.00)
LDL Cholesterol: 87 mg/dL (ref 0–99)
NonHDL: 101.09
Total CHOL/HDL Ratio: 3
Triglycerides: 71 mg/dL (ref 0.0–149.0)
VLDL: 14.2 mg/dL (ref 0.0–40.0)

## 2021-03-22 ENCOUNTER — Other Ambulatory Visit: Payer: Self-pay

## 2021-03-22 ENCOUNTER — Ambulatory Visit: Payer: BC Managed Care – PPO | Attending: Family Medicine | Admitting: Audiology

## 2021-03-22 DIAGNOSIS — H903 Sensorineural hearing loss, bilateral: Secondary | ICD-10-CM | POA: Insufficient documentation

## 2021-03-22 NOTE — Procedures (Signed)
°  Outpatient Audiology and [facility] [place], [place]  [id number] [phone]  AUDIOLOGICAL  EVALUATION  NAME: Wyatt Joseph     DOB:   1965/07/06      MRN: [id number]                                                                                     DATE: 03/22/2021     REFERENT: Vivi Barrack, MD STATUS: Outpatient DIAGNOSIS: Sensorineural hearing loss, bilateral    History: [name] was seen for an audiological evaluation. [name] denies otalgia, tinnitus, dizziness, and aural fullness. [name] denies concerns regarding his hearing sensitivity.    Evaluation:  Otoscopy showed a clear view of the tympanic membranes, bilaterally Tympanometry results were consistent with normal middle ear pressure and normal tympanic membrane mobility, bilaterally Audiometric testing was completed using Conventional Audiometry techniques with insert earphones and TDH headphones. Test results are consistent in the right ear with normal hearing sensitivity sloping to a moderate high frequency sensorineural hearing loss and in the left ear with normal hearing sensitivity sloping to a moderately-severe sensorineural hearing loss. Speech Recognition Thresholds were obtained at 10 dB HL in the right ear and at 15  dB HL in the left ear. Word Recognition Testing was completed at 70 dB HL and [name] scored 100%, bilaterally.    Results:  The test results  and recommendations were reviewed with [name]. Today's test results are consistent in the right ear with normal hearing sensitivity sloping to a moderate high frequency sensorineural hearing loss and in the left ear with normal hearing sensitivity sloping to a moderately-severe sensorineural hearing loss. [name] may have communication difficulty in adverse listening environments. He will benefit from the use of good communication strategies.   Recommendations: 1.   Monitor hearing sensitivity every 2-3 years.     If you have  any questions please feel free to contact me at [phone].  Bari Mantis Audiologist, Au.D., CCC-A 03/22/2021  4:31 PM  Cc: Vivi Barrack, MD

## 2021-03-23 NOTE — Progress Notes (Signed)
Please inform patient of the following:  His labs are all NORMAL. Would like for him to keep up the good work and we can recheck in a year or so. Do not need to make any changes to his treatment plan at this time.  Katina Degree. [name], MD 03/23/2021 8:01 AM

## 2021-04-16 ENCOUNTER — Encounter: Payer: Self-pay | Admitting: Gastroenterology

## 2021-04-20 ENCOUNTER — Other Ambulatory Visit: Payer: Self-pay

## 2021-04-20 ENCOUNTER — Ambulatory Visit (AMBULATORY_SURGERY_CENTER): Payer: BC Managed Care – PPO | Admitting: *Deleted

## 2021-04-20 VITALS — Ht 67.0 in | Wt 160.0 lb

## 2021-04-20 DIAGNOSIS — Z1211 Encounter for screening for malignant neoplasm of colon: Secondary | ICD-10-CM

## 2021-04-20 MED ORDER — CLENPIQ 10-3.5-12 MG-GM -GM/160ML PO SOLN
1.0000 | ORAL | 0 refills | Status: DC
Start: 2021-04-20 — End: 2021-05-29

## 2021-04-20 NOTE — Progress Notes (Signed)
No egg or soy allergy known to patient  °No issues known to pt with past sedation with any surgeries or procedures °Patient denies ever being told they had issues or difficulty with intubation  °No FH of Malignant Hyperthermia °Pt is not on diet pills °Pt is not on  home 02  °Pt is not on blood thinners  °Pt denies issues with constipation  °No A fib or A flutter ° °Pt is fully vaccinated  for Covid  ° °Clenpiq Coupon to pt in PV today , Code to Pharmacy and  NO PA's for preps discussed with pt In PV today  °Discussed with pt there will be an out-of-pocket cost for prep and that varies from $0 to 70 +  dollars - pt verbalized understanding  ° °Due to the COVID-19 pandemic we are asking patients to follow certain guidelines in PV and the [facility]   °Pt aware of COVID protocols and [facility] guidelines  ° °PV completed over the phone. Pt verified name, DOB, address and insurance during PV today.  °Pt mailed instruction packet with copy of consent form to read and not return, and instructions.  °Pt encouraged to call with questions or issues.  °If pt has My chart, procedure instructions sent via My Chart  ° °

## 2021-05-18 ENCOUNTER — Encounter: Payer: Self-pay | Admitting: Gastroenterology

## 2021-05-29 ENCOUNTER — Encounter: Payer: Self-pay | Admitting: Gastroenterology

## 2021-05-29 ENCOUNTER — Ambulatory Visit (AMBULATORY_SURGERY_CENTER): Payer: BC Managed Care – PPO | Admitting: Gastroenterology

## 2021-05-29 VITALS — BP 115/79 | HR 52 | Temp 97.3°F | Resp 13 | Ht 67.0 in | Wt 160.0 lb

## 2021-05-29 DIAGNOSIS — K5730 Diverticulosis of large intestine without perforation or abscess without bleeding: Secondary | ICD-10-CM

## 2021-05-29 DIAGNOSIS — K642 Third degree hemorrhoids: Secondary | ICD-10-CM

## 2021-05-29 DIAGNOSIS — Z1211 Encounter for screening for malignant neoplasm of colon: Secondary | ICD-10-CM

## 2021-05-29 MED ORDER — SODIUM CHLORIDE 0.9 % IV SOLN: 500.0000 mL | Freq: Once | INTRAVENOUS | Status: DC

## 2021-05-29 NOTE — Progress Notes (Signed)
? ?GASTROENTEROLOGY PROCEDURE H&P NOTE  ? ?Primary Care Physician: ?Vivi Barrack, MD ? ? ? ?Reason for Procedure:  Colon Cancer screening ? ?Plan:    Colonoscopy ? ?Patient is appropriate for endoscopic procedure(s) in the ambulatory ([facility]) setting. ? ?The nature of the procedure, as well as the risks, benefits, and alternatives were carefully and thoroughly reviewed with the patient. Ample time for discussion and questions allowed. The patient understood, was satisfied, and agreed to proceed.  ? ? ? ?HPI: ?Wyatt Joseph is a 56 y.o. male who presents for colonoscopy for routine Colon Cancer screening.  No active GI symptoms.  No known family history of colon cancer or related malignancy.  Patient is otherwise without complaints or active issues today. ? ?Past Medical History:  ?Diagnosis Date  ? GERD (gastroesophageal reflux disease)   ? past hx , minor  ? Hemorrhoids   ? PONV (postoperative nausea and vomiting)   ? after appendectomy buy had eaten prior to emergent surgery  ? Sleep apnea   ? mild- moderate - uses cpap  ? ? ?Past Surgical History:  ?Procedure Laterality Date  ? APPENDECTOMY    ? 1989  ? COLONOSCOPY    ? over seas colon in ~2012 per pt  ? ? ?Prior to Admission medications   ?Medication Sig Start Date End Date Taking? Authorizing Provider  ?aspirin 81 MG EC tablet Take by mouth. Not daily    [provider]  ?MAGNESIUM PO Take by mouth. Takes not on a regular basis    [provider]  ?Multiple Vitamin (MULTI-VITAMIN) tablet Take 1 tablet by mouth daily.    [provider]  ? ? ?Current Outpatient Medications  ?Medication Sig Dispense Refill  ? aspirin 81 MG EC tablet Take by mouth. Not daily    ? MAGNESIUM PO Take by mouth. Takes not on a regular basis    ? Multiple Vitamin (MULTI-VITAMIN) tablet Take 1 tablet by mouth daily.    ? ?Current Facility-Administered Medications  ?Medication Dose Route Frequency Provider Last Rate Last Admin  ? 0.9 %  sodium chloride  infusion  500 mL Intravenous Once Laurin Paulo V, DO      ? ? ?Allergies as of 05/29/2021  ? (No Known Allergies)  ? ? ?Family History  ?Problem Relation Age of Onset  ? Cancer Neg Hx   ? Colon cancer Neg Hx   ? Colon polyps Neg Hx   ? Esophageal cancer Neg Hx   ? Rectal cancer Neg Hx   ? Stomach cancer Neg Hx   ? ? ?Social History  ? ?Socioeconomic History  ? Marital status: Married  ?  Spouse name: Not on file  ? Number of children: 2  ? Years of education: Not on file  ? Highest education level: Not on file  ?Occupational History  ? Not on file  ?Tobacco Use  ? Smoking status: Never  ? Smokeless tobacco: Never  ? Tobacco comments:  ?  Regular Exercise - Yes  ?Substance and Sexual Activity  ? Alcohol use: No  ? Drug use: No  ? Sexual activity: Yes  ?  Birth control/protection: Condom  ?Other Topics Concern  ? Not on file  ?Social History Narrative  ? Not on file  ? ?Social Determinants of Health  ? ?Financial Resource Strain: Not on file  ?Food Insecurity: Not on file  ?Transportation Needs: Not on file  ?Physical Activity: Not on file  ?Stress: Not on file  ?Social Connections: Not  on file  ?Intimate Partner Violence: Not on file  ? ? ?Physical Exam: ?Vital signs in last 24 hours: ?@BP  (!) 172/90   Pulse 72   Temp (!) 97.3 ?F (36.3 ?C)   Ht 5\' 7"  (1.702 m)   Wt 160 lb (72.6 kg)   SpO2 100%   BMI 25.06 kg/m?  ?GEN: NAD ?EYE: Sclerae anicteric ?ENT: MMM ?CV: Non-tachycardic ?Pulm: CTA b/l ?GI: Soft, NT/ND ?NEURO:  Alert & Oriented x 3 ? ? ?Gerrit Heck, DO ?[facility] ? ? ?05/29/2021 8:53 AM ? ?

## 2021-05-29 NOTE — Patient Instructions (Signed)
YOU HAD AN ENDOSCOPIC PROCEDURE TODAY AT THE [facility]:   Refer to the procedure report that was given to you for any specific questions about what was found during the examination.  If the procedure report does not answer your questions, please call your gastroenterologist to clarify.  If you requested that your care partner not be given the details of your procedure findings, then the procedure report has been included in a sealed envelope for you to review at your convenience later. ? ?**Handouts given on Diverticulosis and hemorrhoids** ? ?YOU SHOULD EXPECT: Some feelings of bloating in the abdomen. Passage of more gas than usual.  Walking can help get rid of the air that was put into your GI tract during the procedure and reduce the bloating. If you had a lower endoscopy (such as a colonoscopy or flexible sigmoidoscopy) you may notice spotting of blood in your stool or on the toilet paper. If you underwent a bowel prep for your procedure, you may not have a normal bowel movement for a few days. ? ?Please Note:  You might notice some irritation and congestion in your nose or some drainage.  This is from the oxygen used during your procedure.  There is no need for concern and it should clear up in a day or so. ? ?SYMPTOMS TO REPORT IMMEDIATELY: ? ?Following lower endoscopy (colonoscopy or flexible sigmoidoscopy): ? Excessive amounts of blood in the stool ? Significant tenderness or worsening of abdominal pains ? Swelling of the abdomen that is new, acute ? Fever of 100?F or higher ? ? ?For urgent or emergent issues, a gastroenterologist can be reached at any hour by calling [phone]. ?Do not use MyChart messaging for urgent concerns.  ? ? ?DIET:  We do recommend a small meal at first, but then you may proceed to your regular diet.  Drink plenty of fluids but you should avoid alcoholic beverages for 24 hours. ? ?ACTIVITY:  You should plan to take it easy for the rest of today and you should  NOT DRIVE or use heavy machinery until tomorrow (because of the sedation medicines used during the test).   ? ?FOLLOW UP: ?Our staff will call the number listed on your records 48-72 hours following your procedure to check on you and address any questions or concerns that you may have regarding the information given to you following your procedure. If we do not reach you, we will leave a message.  We will attempt to reach you two times.  During this call, we will ask if you have developed any symptoms of COVID 19. If you develop any symptoms (ie: fever, flu-like symptoms, shortness of breath, cough etc.) before then, please call [phone].  If you test positive for Covid 19 in the 2 weeks post procedure, please call and report this information to us.   ? ?If any biopsies were taken you will be contacted by phone or by letter within the next 1-3 weeks.  Please call us at [phone] if you have not heard about the biopsies in 3 weeks.  ? ? ?SIGNATURES/CONFIDENTIALITY: ?You and/or your care partner have signed paperwork which will be entered into your electronic medical record.  These signatures attest to the fact that that the information above on your After Visit Summary has been reviewed and is understood.  Full responsibility of the confidentiality of this discharge information lies with you and/or your care-partner.  ?

## 2021-05-29 NOTE — Progress Notes (Signed)
Pt's states no medical or surgical changes since previsit or office visit. 

## 2021-05-29 NOTE — Op Note (Signed)
[facility] ?Patient Name: Wyatt Joseph ?Procedure Date: 05/29/2021 8:56 AM ?MRN: [id number] ?Endoscopist: Gerrit Heck , MD ?Age: 56 ?Referring MD:  ?Date of Birth: Jun 18, 1965 ?Gender: Male ?Account #: [id number] ?Procedure:                Colonoscopy ?Indications:              Screening for colorectal malignant neoplasm. Last  ?                          colonoscopy was 10 years ago at outside facility  ?                          and notable for diverticulosis. ?Medicines:                Monitored Anesthesia Care ?Procedure:                Pre-Anesthesia Assessment: ?                          - Prior to the procedure, a History and Physical  ?                          was performed, and patient medications and  ?                          allergies were reviewed. The patient's tolerance of  ?                          previous anesthesia was also reviewed. The risks  ?                          and benefits of the procedure and the sedation  ?                          options and risks were discussed with the patient.  ?                          All questions were answered, and informed consent  ?                          was obtained. Prior Anticoagulants: The patient has  ?                          taken no previous anticoagulant or antiplatelet  ?                          agents. ASA Grade Assessment: II - A patient with  ?                          mild systemic disease. After reviewing the risks  ?                          and benefits, the patient was deemed in  ?  satisfactory condition to undergo the procedure. ?                          After obtaining informed consent, the colonoscope  ?                          was passed under direct vision. Throughout the  ?                          procedure, the patient's blood pressure, pulse, and  ?                          oxygen saturations were monitored continuously. The  ?                          Colonoscope was introduced  through the anus and  ?                          advanced to the the cecum, identified by  ?                          appendiceal orifice and ileocecal valve. The  ?                          colonoscopy was performed without difficulty. The  ?                          patient tolerated the procedure well. The quality  ?                          of the bowel preparation was good. The ileocecal  ?                          valve, appendiceal orifice, and rectum were  ?                          photographed. ?Scope In: 9:00:25 AM ?Scope Out: 9:13:00 AM ?Scope Withdrawal Time: 0 hours 9 minutes 41 seconds  ?Total Procedure Duration: 0 hours 12 minutes 35 seconds  ?Findings:                 Hemorrhoids were found on perianal exam. ?                          Multiple small and large-mouthed diverticula were  ?                          found in the entire colon. ?                          Non-bleeding external and internal hemorrhoids were  ?                          found during retroflexion. The hemorrhoids were  ?  moderate and Grade III (internal hemorrhoids that  ?                          prolapse but require manual reduction). ?Complications:            No immediate complications. ?Estimated Blood Loss:     Estimated blood loss: none. ?Impression:               - Hemorrhoids found on perianal exam. ?                          - Diverticulosis in the entire examined colon. ?                          - Non-bleeding external and internal hemorrhoids. ?                          - No specimens collected. ?Recommendation:           - Patient has a contact number available for  ?                          emergencies. The signs and symptoms of potential  ?                          delayed complications were discussed with the  ?                          patient. Return to normal activities tomorrow.  ?                          Written discharge instructions were provided to the  ?                           patient. ?                          - Resume previous diet. ?                          - Continue present medications. ?                          - Await pathology results. ?                          - Repeat colonoscopy in 10 years for screening  ?                          purposes. ?                          - Use fiber, for example Citrucel, Fibercon, Konsyl  ?                          or Metamucil. ?                          -  If considering hemorrhoid intervention, based on  ?                          size and appearance, recommend referral to  ?                          [facility] for evaluation and  ?                          possible intervention. ?Gerrit Heck, MD ?05/29/2021 9:18:47 AM ?

## 2021-05-29 NOTE — Progress Notes (Signed)
To Pacu, VSS. Report to [email or web address] 

## 2021-05-31 ENCOUNTER — Telehealth: Payer: Self-pay

## 2021-05-31 NOTE — Telephone Encounter (Signed)
?  Follow up Call- ? ? ?  05/29/2021  ?  8:33 AM  ?Call back number  ?Post procedure Call Back phone  # [phone]  ?Permission to leave phone message Yes  ?  ? ?Patient questions: ? ?Do you have a fever, pain , or abdominal swelling? No. ?Pain Score  0 * ? ?Have you tolerated food without any problems? Yes.   ? ?Have you been able to return to your normal activities? Yes.   ? ?Do you have any questions about your discharge instructions: ?Diet   No. ?Medications  No. ?Follow up visit  No. ? ?Do you have questions or concerns about your Care? No. ? ?Actions: ?* If pain score is 4 or above: ?No action needed, pain <4. ? ? ?

## 2021-10-29 ENCOUNTER — Encounter: Payer: Self-pay | Admitting: *Deleted

## 2022-01-17 ENCOUNTER — Encounter: Payer: Self-pay | Admitting: *Deleted

## 2022-08-27 ENCOUNTER — Encounter: Payer: Self-pay | Admitting: Family Medicine

## 2022-08-27 ENCOUNTER — Ambulatory Visit: Payer: BC Managed Care – PPO | Admitting: Family Medicine

## 2022-08-27 VITALS — BP 160/94 | HR 64 | Temp 97.8°F | Ht 67.0 in | Wt 172.0 lb

## 2022-08-27 DIAGNOSIS — E785 Hyperlipidemia, unspecified: Secondary | ICD-10-CM

## 2022-08-27 DIAGNOSIS — Z125 Encounter for screening for malignant neoplasm of prostate: Secondary | ICD-10-CM

## 2022-08-27 DIAGNOSIS — R739 Hyperglycemia, unspecified: Secondary | ICD-10-CM

## 2022-08-27 DIAGNOSIS — R030 Elevated blood-pressure reading, without diagnosis of hypertension: Secondary | ICD-10-CM | POA: Insufficient documentation

## 2022-08-27 LAB — HEMOGLOBIN A1C: Hgb A1c MFr Bld: 5.2 % (ref 4.6–6.5)

## 2022-08-27 LAB — PSA: PSA: 1.16 ng/mL (ref 0.10–4.00)

## 2022-08-27 LAB — LIPID PANEL
Cholesterol: 153 mg/dL (ref 0–200)
HDL: 50.6 mg/dL (ref >39.00)
LDL Cholesterol: 85 mg/dL (ref 0–99)
NonHDL: 102.71
Total CHOL/HDL Ratio: 3
Triglycerides: 90 mg/dL (ref 0.0–149.0)
VLDL: 18 mg/dL (ref 0.0–40.0)

## 2022-08-27 LAB — CBC
HCT: 48.5 % (ref 39.0–52.0)
Hemoglobin: 16.1 g/dL (ref 13.0–17.0)
MCHC: 33.2 g/dL (ref 30.0–36.0)
MCV: 87.7 fl (ref 78.0–100.0)
Platelets: 221 10*3/uL (ref 150.0–400.0)
RBC: 5.53 Mil/uL (ref 4.22–5.81)
RDW: 12.5 % (ref 11.5–15.5)
WBC: 3.9 10*3/uL — ABNORMAL LOW (ref 4.0–10.5)

## 2022-08-27 LAB — COMPREHENSIVE METABOLIC PANEL
ALT: 17 U/L (ref 0–53)
AST: 21 U/L (ref 0–37)
Albumin: 4.8 g/dL (ref 3.5–5.2)
Alkaline Phosphatase: 79 U/L (ref 39–117)
BUN: 14 mg/dL (ref 6–23)
CO2: 28 mEq/L (ref 19–32)
Calcium: 9.8 mg/dL (ref 8.4–10.5)
Chloride: 103 mEq/L (ref 96–112)
Creatinine, Ser: 0.88 mg/dL (ref 0.40–1.50)
GFR: 95.06 mL/min (ref >60.00)
Glucose, Bld: 96 mg/dL (ref 70–99)
Potassium: 3.7 mEq/L (ref 3.5–5.1)
Sodium: 140 mEq/L (ref 135–145)
Total Bilirubin: 1.4 mg/dL — ABNORMAL HIGH (ref 0.2–1.2)
Total Protein: 7.1 g/dL (ref 6.0–8.3)

## 2022-08-27 LAB — TSH: TSH: 0.91 u[IU]/mL (ref 0.35–5.50)

## 2022-08-27 NOTE — Patient Instructions (Signed)
It was very nice to see you today!  Please monitor your blood pressure at home and let us know if you are averaging 140/90 or higher or if you notice any readings greater than 180/120.  We will check blood work today.  Return if symptoms worsen or fail to improve.   Take care, Dr [name]  PLEASE NOTE:  If you had any lab tests, please let us know if you have not heard back within a few days. You may see your results on mychart before we have a chance to review them but we will give you a call once they are reviewed by [place].   If we ordered any referrals today, please let us know if you have not heard from their office within the next week.   If you had any urgent prescriptions sent in today, please check with the pharmacy within an hour of our visit to make sure the prescription was transmitted appropriately.   Please try these tips to maintain a healthy lifestyle:  Eat at least 3 REAL meals and 1-2 snacks per day.  Aim for no more than 5 hours between eating.  If you eat breakfast, please do so within one hour of getting up.   Each meal should contain half fruits/vegetables, one quarter protein, and one quarter carbs (no bigger than a computer mouse)  Cut down on sweet beverages. This includes juice, soda, and sweet tea.   Drink at least 1 glass of water with each meal and aim for at least 8 glasses per day  Exercise at least 150 minutes every week.

## 2022-08-27 NOTE — Assessment & Plan Note (Signed)
Check lipids 

## 2022-08-27 NOTE — Assessment & Plan Note (Signed)
Check A1c. 

## 2022-08-27 NOTE — Assessment & Plan Note (Signed)
Home readings have been typically at goal with an occasional reading in the 140s to 150s over 70s.  Does occasionally have a reading of the 160s over 90s as well.  Discussed with patient that home readings are mostly at goal and that his average blood pressure is at goal as well.  His blood pressure was elevated today though he likely does have some element of whitecoat hypertension as well.  We discussed management options.  We did discuss conservative measures including 30 to 60 minutes of cardiovascular exercise daily and diet low in sodium.  We also discussed medications.  Given that his pressure readings at home have typically been well-controlled do not think we need to start antihypertensives at this point.  He will continue to monitor at home and let us know if persistently elevated.  Will consider starting amlodipine if needed at that point.

## 2022-08-27 NOTE — Progress Notes (Signed)
   Wyatt Joseph is a 58 y.o. male who presents today for an office visit.  Assessment/Plan:  Chronic Problems Addressed Today: Elevated blood pressure reading Home readings have been typically at goal with an occasional reading in the 140s to 150s over 70s.  Does occasionally have a reading of the 160s over 90s as well.  Discussed with patient that home readings are mostly at goal and that his average blood pressure is at goal as well.  His blood pressure was elevated today though he likely does have some element of whitecoat hypertension as well.  We discussed management options.  We did discuss conservative measures including 30 to 60 minutes of cardiovascular exercise daily and diet low in sodium.  We also discussed medications.  Given that his pressure readings at home have typically been well-controlled do not think we need to start antihypertensives at this point.  He will continue to monitor at home and let us know if persistently elevated.  Will consider starting amlodipine if needed at that point.  Hyperglycemia Check A1c.   Dyslipidemia Check lipids.      Subjective:  HPI:  See Assessment / plan for status of chronic conditions.   Main concern today is blood pressure.  We last saw him about 18 months ago.  He was in [place] for about 8 months due to work but is now back in [place] for the next few months.  He has been monitoring his blood pressure at home.  He has noticed they have been fluctuating.  Typically his readings are in the 110s over 70s to 120s over 70s.  Does occasionally get readings into the 130s and 140s and a handful of readings into the 150s and 160s.  Does not have any symptoms with this.  Does admit to having quite a bit of anxiety about his blood pressure but she does note blood pressure typically comes down after rechecking again in a few minutes.  Highest reading at the 160s over 90s.  No reported chest pain or shortness of breath.  No headache.  No  vision changes.  He did have a subconjunctival hematoma recently but this resolved without any issues.         Objective:  Physical Exam: BP (!) 160/94   Pulse 64   Temp 97.8 F (36.6 C)   Ht 5\' 7"  (1.702 m)   Wt 172 lb (78 kg)   SpO2 98%   BMI 26.94 kg/m   Wt Readings from Last 3 Encounters:  08/27/22 172 lb (78 kg)  05/29/21 160 lb (72.6 kg)  04/20/21 160 lb (72.6 kg)    Gen: No acute distress, resting comfortably CV: Regular rate and rhythm with no murmurs appreciated Pulm: Normal work of breathing, clear to auscultation bilaterally with no crackles, wheezes, or rhonchi Neuro: Grossly normal, moves all extremities Psych: Normal affect and thought content      Wyatt Joseph M. [name], MD 08/27/2022 9:10 AM

## 2022-08-28 ENCOUNTER — Encounter: Payer: Self-pay | Admitting: Family Medicine

## 2022-08-28 ENCOUNTER — Telehealth: Payer: Self-pay | Admitting: Family Medicine

## 2022-08-28 NOTE — Progress Notes (Signed)
Great news!  Labs are all stable.  Do not need to make any changes to treatment plan at this time.  He should continue to work on diet and exercise and we can recheck everything in a year or so.

## 2022-08-28 NOTE — Telephone Encounter (Signed)
LVM for patient to schedule CPE since overdue.

## 2022-10-31 ENCOUNTER — Encounter: Payer: BC Managed Care – PPO | Admitting: Family Medicine

## 2023-03-07 IMAGING — DX DG CHEST 1V PORT
1 series · 1 of 1 positions shown · non-contrast
Comparison: None.

CLINICAL DATA: Left rib pain.  Chest pain.  Left shoulder pain.

EXAM:
PORTABLE CHEST 1 VIEW

[chest]
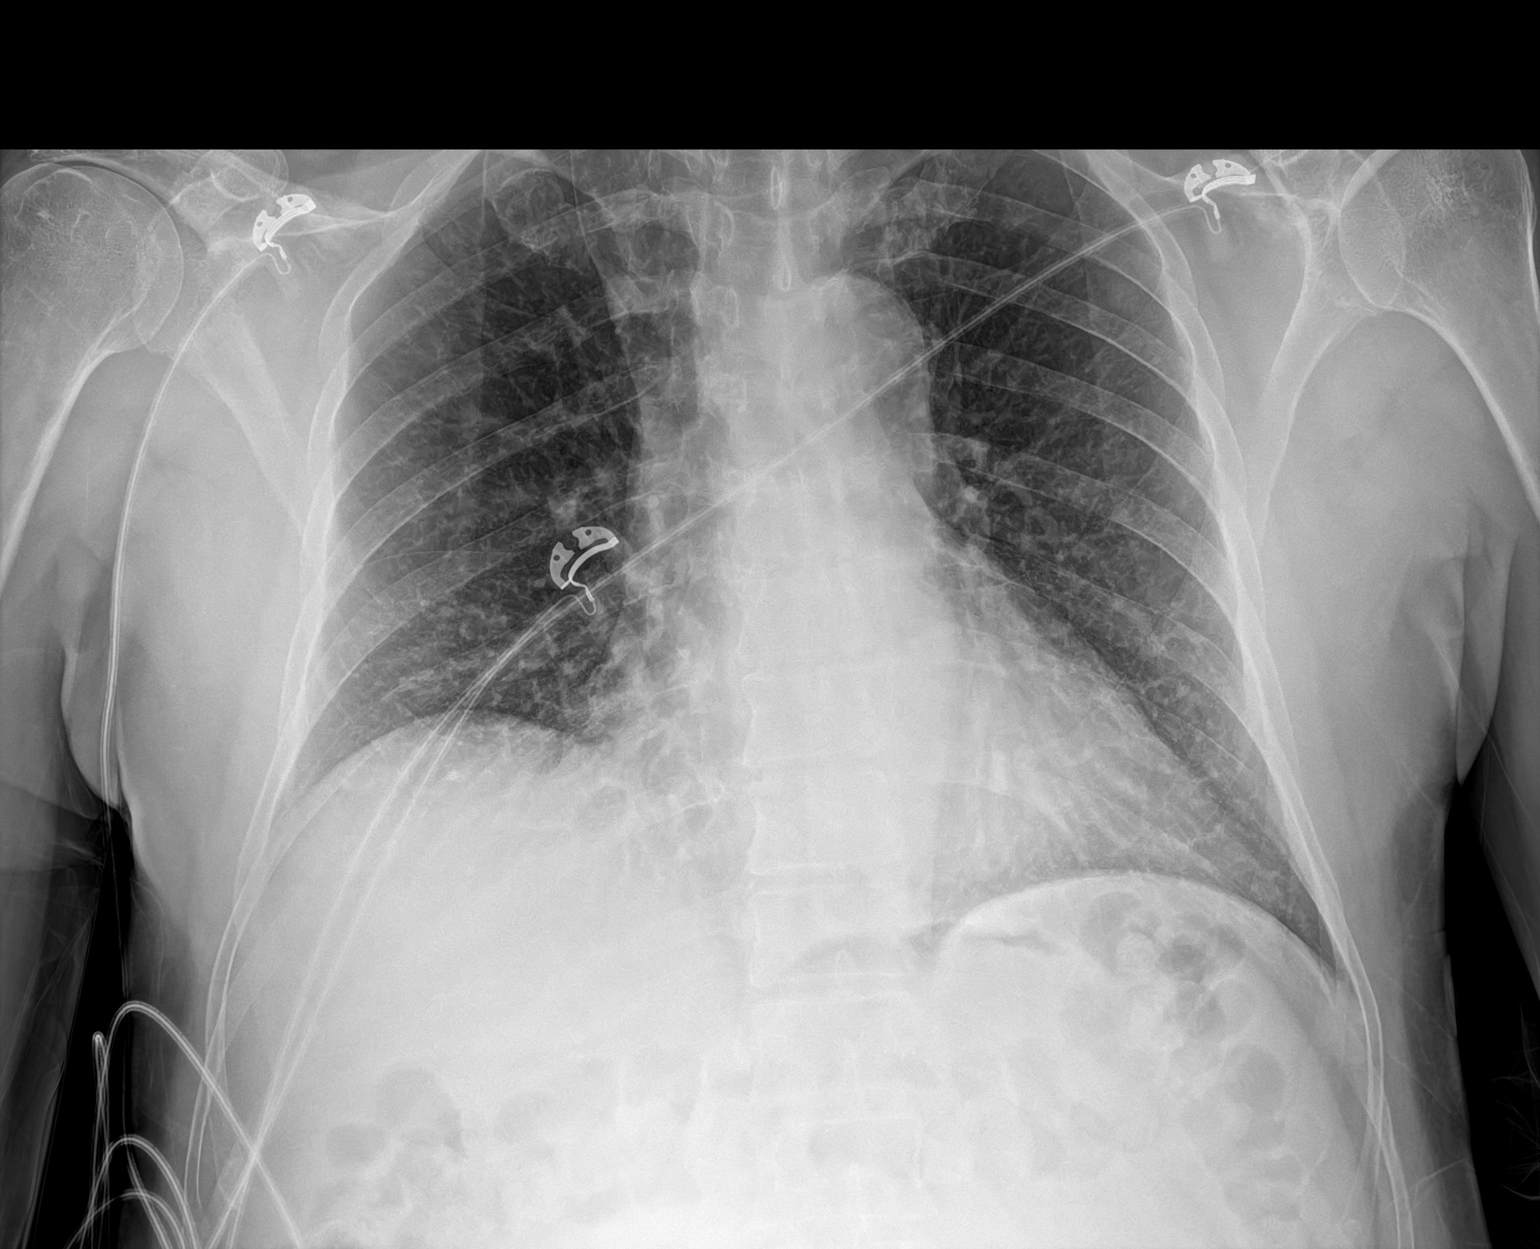

[1 of 1 positions shown; findings below may reference images not displayed]

FINDINGS: Upper normal heart size likely accentuated by AP technique. Normal
mediastinal contours. Mild elevation of right hemidiaphragm.
Incidental azygos fissure. Mild interstitial coarsening of unknown
acuity. No focal airspace disease, pleural effusion, or
pneumothorax. No acute osseous abnormalities are seen. No focal left
rib abnormality on this portable AP view.
IMPRESSION: 1. Mild interstitial coarsening of unknown acuity, but favored to be
chronic.
2. No explanation for left-sided pain or focal left rib abnormality
on this portable AP view.

## 2023-06-16 ENCOUNTER — Ambulatory Visit (INDEPENDENT_AMBULATORY_CARE_PROVIDER_SITE_OTHER): Payer: Self-pay | Admitting: Family Medicine

## 2023-06-16 ENCOUNTER — Encounter: Payer: Self-pay | Admitting: Family Medicine

## 2023-06-16 VITALS — BP 135/81 | HR 71 | Temp 97.2°F | Ht 67.0 in | Wt 172.0 lb

## 2023-06-16 DIAGNOSIS — E785 Hyperlipidemia, unspecified: Secondary | ICD-10-CM | POA: Diagnosis not present

## 2023-06-16 DIAGNOSIS — Z0001 Encounter for general adult medical examination with abnormal findings: Secondary | ICD-10-CM | POA: Diagnosis not present

## 2023-06-16 DIAGNOSIS — R739 Hyperglycemia, unspecified: Secondary | ICD-10-CM | POA: Diagnosis not present

## 2023-06-16 DIAGNOSIS — Z125 Encounter for screening for malignant neoplasm of prostate: Secondary | ICD-10-CM | POA: Diagnosis not present

## 2023-06-16 DIAGNOSIS — G4733 Obstructive sleep apnea (adult) (pediatric): Secondary | ICD-10-CM

## 2023-06-16 DIAGNOSIS — R030 Elevated blood-pressure reading, without diagnosis of hypertension: Secondary | ICD-10-CM | POA: Diagnosis not present

## 2023-06-16 LAB — CBC WITH DIFFERENTIAL/PLATELET
Basophils Absolute: 0 10*3/uL (ref 0.0–0.1)
Basophils Relative: 1.1 % (ref 0.0–3.0)
Eosinophils Absolute: 0.1 10*3/uL (ref 0.0–0.7)
Eosinophils Relative: 2 % (ref 0.0–5.0)
HCT: 48.1 % (ref 39.0–52.0)
Hemoglobin: 15.9 g/dL (ref 13.0–17.0)
Lymphocytes Relative: 29.6 % (ref 12.0–46.0)
Lymphs Abs: 1 10*3/uL (ref 0.7–4.0)
MCHC: 33.1 g/dL (ref 30.0–36.0)
MCV: 88.1 fl (ref 78.0–100.0)
Monocytes Absolute: 0.3 10*3/uL (ref 0.1–1.0)
Monocytes Relative: 8.8 % (ref 3.0–12.0)
Neutro Abs: 1.9 10*3/uL (ref 1.4–7.7)
Neutrophils Relative %: 58.5 % (ref 43.0–77.0)
Platelets: 225 10*3/uL (ref 150.0–400.0)
RBC: 5.47 Mil/uL (ref 4.22–5.81)
RDW: 13 % (ref 11.5–15.5)
WBC: 3.3 10*3/uL — ABNORMAL LOW (ref 4.0–10.5)

## 2023-06-16 LAB — COMPREHENSIVE METABOLIC PANEL WITH GFR
ALT: 16 U/L (ref 0–53)
AST: 21 U/L (ref 0–37)
Albumin: 4.8 g/dL (ref 3.5–5.2)
Alkaline Phosphatase: 77 U/L (ref 39–117)
BUN: 10 mg/dL (ref 6–23)
CO2: 29 meq/L (ref 19–32)
Calcium: 9.6 mg/dL (ref 8.4–10.5)
Chloride: 104 meq/L (ref 96–112)
Creatinine, Ser: 0.78 mg/dL (ref 0.40–1.50)
GFR: 98.03 mL/min (ref >60.00)
Glucose, Bld: 94 mg/dL (ref 70–99)
Potassium: 4.1 meq/L (ref 3.5–5.1)
Sodium: 140 meq/L (ref 135–145)
Total Bilirubin: 0.8 mg/dL (ref 0.2–1.2)
Total Protein: 7.2 g/dL (ref 6.0–8.3)

## 2023-06-16 LAB — HEMOGLOBIN A1C: Hgb A1c MFr Bld: 5.3 % (ref 4.6–6.5)

## 2023-06-16 LAB — LIPID PANEL
Cholesterol: 166 mg/dL (ref 0–200)
HDL: 49.4 mg/dL (ref >39.00)
LDL Cholesterol: 97 mg/dL (ref 0–99)
NonHDL: 116.48
Total CHOL/HDL Ratio: 3
Triglycerides: 95 mg/dL (ref 0.0–149.0)
VLDL: 19 mg/dL (ref 0.0–40.0)

## 2023-06-16 LAB — PSA: PSA: 1.76 ng/mL (ref 0.10–4.00)

## 2023-06-16 LAB — TSH: TSH: 0.88 u[IU]/mL (ref 0.35–5.50)

## 2023-06-16 NOTE — Assessment & Plan Note (Signed)
 Check A1c.

## 2023-06-16 NOTE — Assessment & Plan Note (Signed)
 Check lipids. Discussed lifestyle modifications.

## 2023-06-16 NOTE — Assessment & Plan Note (Signed)
 Initially elevated however at goal on recheck.  Likely has whitecoat hypertension.  He will monitor at home and let us  know if persistently elevated.  We discussed lifestyle interventions.

## 2023-06-16 NOTE — Patient Instructions (Signed)
 It was very nice to see you today!  We will check blood work today.  Please continue to monitor your blood pressure at home and let us  know if persistently elevated.  Will see back in year for your next physical.  Come back sooner if needed.  Return in about 1 year (around 06/15/2024) for Annual Physical.   Take care, Dr Daneil Dunker  PLEASE NOTE:  If you had any lab tests, please let us  know if you have not heard back within a few days. You may see your results on mychart before we have a chance to review them but we will give you a call once they are reviewed by us .   If we ordered any referrals today, please let us  know if you have not heard from their office within the next week.   If you had any urgent prescriptions sent in today, please check with the pharmacy within an hour of our visit to make sure the prescription was transmitted appropriately.   Please try these tips to maintain a healthy lifestyle:  Eat at least 3 REAL meals and 1-2 snacks per day.  Aim for no more than 5 hours between eating.  If you eat breakfast, please do so within one hour of getting up.   Each meal should contain half fruits/vegetables, one quarter protein, and one quarter carbs (no bigger than a computer mouse)  Cut down on sweet beverages. This includes juice, soda, and sweet tea.   Drink at least 1 glass of water with each meal and aim for at least 8 glasses per day  Exercise at least 150 minutes every week.    Preventive Care 46-34 Years Old, Male Preventive care refers to lifestyle choices and visits with your health care provider that can promote health and wellness. Preventive care visits are also called wellness exams. What can I expect for my preventive care visit? Counseling During your preventive care visit, your health care provider may ask about your: Medical history, including: Past medical problems. Family medical history. Current health, including: Emotional well-being. Home life  and relationship well-being. Sexual activity. Lifestyle, including: Alcohol, nicotine or tobacco, and drug use. Access to firearms. Diet, exercise, and sleep habits. Safety issues such as seatbelt and bike helmet use. Sunscreen use. Work and work Astronomer. Physical exam Your health care provider will check your: Height and weight. These may be used to calculate your BMI (body mass index). BMI is a measurement that tells if you are at a healthy weight. Waist circumference. This measures the distance around your waistline. This measurement also tells if you are at a healthy weight and may help predict your risk of certain diseases, such as type 2 diabetes and high blood pressure. Heart rate and blood pressure. Body temperature. Skin for abnormal spots. What immunizations do I need?  Vaccines are usually given at various ages, according to a schedule. Your health care provider will recommend vaccines for you based on your age, medical history, and lifestyle or other factors, such as travel or where you work. What tests do I need? Screening Your health care provider may recommend screening tests for certain conditions. This may include: Lipid and cholesterol levels. Diabetes screening. This is done by checking your blood sugar (glucose) after you have not eaten for a while (fasting). Hepatitis B test. Hepatitis C test. HIV (human immunodeficiency virus) test. STI (sexually transmitted infection) testing, if you are at risk. Lung cancer screening. Prostate cancer screening. Colorectal cancer screening. Talk with your  health care provider about your test results, treatment options, and if necessary, the need for more tests. Follow these instructions at home: Eating and drinking  Eat a diet that includes fresh fruits and vegetables, whole grains, lean protein, and low-fat dairy products. Take vitamin and mineral supplements as recommended by your health care provider. Do not drink  alcohol if your health care provider tells you not to drink. If you drink alcohol: Limit how much you have to 0-2 drinks a day. Know how much alcohol is in your drink. In the [place] one drink equals one 12 oz bottle of beer (355 mL), one 5 oz glass of wine (148 mL), or one 1 oz glass of hard liquor (44 mL). Lifestyle Brush your teeth every morning and night with fluoride toothpaste. Floss one time each day. Exercise for at least 30 minutes 5 or more days each week. Do not use any products that contain nicotine or tobacco. These products include cigarettes, chewing tobacco, and vaping devices, such as e-cigarettes. If you need help quitting, ask your health care provider. Do not use drugs. If you are sexually active, practice safe sex. Use a condom or other form of protection to prevent STIs. Take aspirin only as told by your health care provider. Make sure that you understand how much to take and what form to take. Work with your health care provider to find out whether it is safe and beneficial for you to take aspirin daily. Find healthy ways to manage stress, such as: Meditation, yoga, or listening to music. Journaling. Talking to a trusted person. Spending time with friends and family. Minimize exposure to UV radiation to reduce your risk of skin cancer. Safety Always wear your seat belt while driving or riding in a vehicle. Do not drive: If you have been drinking alcohol. Do not ride with someone who has been drinking. When you are tired or distracted. While texting. If you have been using any mind-altering substances or drugs. Wear a helmet and other protective equipment during sports activities. If you have firearms in your house, make sure you follow all gun safety procedures. What's next? Go to your health care provider once a year for an annual wellness visit. Ask your health care provider how often you should have your eyes and teeth checked. Stay up to date on all  vaccines. This information is not intended to replace advice given to you by your health care provider. Make sure you discuss any questions you have with your health care provider. Document Revised: 07/19/2020 Document Reviewed: 07/19/2020 Elsevier Patient Education  2024 [employer].

## 2023-06-16 NOTE — Assessment & Plan Note (Signed)
 Doing well status post inspire device placement.

## 2023-06-16 NOTE — Progress Notes (Signed)
 Chief Complaint:  Wyatt Joseph is a 58 y.o. male who presents today for his annual comprehensive physical exam.    Assessment/Plan:  Chronic Problems Addressed Today: Dyslipidemia Check lipids.  Discussed lifestyle modifications.  Hyperglycemia Check A1c.  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  Elevated blood pressure reading Initially elevated however at goal on recheck.  Likely has whitecoat hypertension.  He will monitor at home and let us  know if persistently elevated.  We discussed lifestyle interventions.  Preventative Healthcare: Check labs. UpToDate on other screenings.  Due for next colonoscopy in 2033.  Due for shingles vaccine though declined this today.  Patient Counseling(The following topics were reviewed and/or handout was given):  -Nutrition: Stressed importance of moderation in sodium/caffeine intake, saturated fat and cholesterol, caloric balance, sufficient intake of fresh fruits, vegetables, and fiber.  -Stressed the importance of regular exercise.   -Substance Abuse: Discussed cessation/primary prevention of tobacco, alcohol, or other drug use; driving or other dangerous activities under the influence; availability of treatment for abuse.   -Injury prevention: Discussed safety belts, safety helmets, smoke detector, smoking near bedding or upholstery.   -Sexuality: Discussed sexually transmitted diseases, partner selection, use of condoms, avoidance of unintended pregnancy and contraceptive alternatives.   -Dental health: Discussed importance of regular tooth brushing, flossing, and dental visits.  -Health maintenance and immunizations reviewed. Please refer to Health maintenance section.  Return to care in 1 year for next preventative visit.     Subjective:  HPI:  He has no acute complaints today. Patient here today for annual physical.   Lifestyle Diet: Balanced. Plenty of fruits and  vegetables.  Exercise: Tries to run routinely.      06/16/2023   10:03 AM  Depression screen PHQ 2/9  Decreased Interest 0  Down, Depressed, Hopeless 0  PHQ - 2 Score 0    There are no preventive care reminders to display for this patient.   ROS: Per HPI, otherwise a complete review of systems was negative.   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 Not daily     MAGNESIUM PO Take  by mouth. Takes not on a regular basis     Multiple Vitamin (MULTI-VITAMIN) tablet Take 1 tablet by mouth daily.     No current facility-administered medications for this visit.    Allergies-reviewed and updated No Known Allergies  Social History   Socioeconomic History   Marital status: Married    Spouse name: Not on file    Number of children: 2   Years of education: Not on file   Highest education level: Not on file  Occupational History   Not on file  Tobacco Use   Smoking status: Never   Smokeless tobacco: Never   Tobacco comments:    Regular Exercise - Yes  Substance and Sexual Activity   Alcohol use: No   Drug use: No   Sexual activity: Yes    Birth control/protection: Condom  Other Topics Concern   Not on file  Social History Narrative   Not on file   Social Drivers of Health   Financial Resource Strain: Not on file  Food Insecurity: Not on file  Transportation Needs: Not on file  Physical Activity: Not on file  Stress: Not on file  Social Connections: Not on file        Objective:  Physical Exam: BP 135/81   Pulse 71   Temp (!) 97.2 F (36.2 C) (Temporal)   Ht 5\' 7"  (1.702 m)   Wt 172 lb (78 kg)   SpO2 99%   BMI 26.94 kg/m   Body mass index is 26.94 kg/m. Wt Readings from Last 3 Encounters:  06/16/23 172 lb (78 kg)  08/27/22 172 lb (78 kg)  05/29/21 160 lb (72.6 kg)   Gen: NAD, resting comfortably HEENT: TMs normal bilaterally. OP clear. No thyromegaly noted.  CV: RRR with no murmurs appreciated Pulm: NWOB, CTAB with no crackles, wheezes, or rhonchi GI: Normal bowel sounds present. Soft, Nontender, Nondistended. MSK: no edema, cyanosis, or clubbing noted Skin: warm, dry Neuro: CN2-12 grossly intact. Strength 5/5 in upper and lower extremities. Reflexes symmetric and intact bilaterally.  Psych: Normal affect and thought content     Charidy Cappelletti M. Daneil Dunker, MD 06/16/2023 10:58 AM

## 2023-06-18 ENCOUNTER — Ambulatory Visit: Payer: Self-pay | Admitting: Family Medicine

## 2023-06-18 NOTE — Progress Notes (Signed)
 His white blood cell count is mildly decreased.  This is probably nothing to worry about however given that it has been downtrending the last couple years it would be a good idea for him to see hematologist for further evaluation.  Please place referral if he is agreeable.  The rest of his labs are all at goal.  Do not need to make any other changes to his treatment plan at this time.  He should keep up the great work with diet and exercise and we can recheck everything in a year or so.

## 2023-08-26 ENCOUNTER — Other Ambulatory Visit: Payer: Self-pay | Admitting: *Deleted

## 2023-08-26 ENCOUNTER — Ambulatory Visit (INDEPENDENT_AMBULATORY_CARE_PROVIDER_SITE_OTHER): Admitting: Family Medicine

## 2023-08-26 ENCOUNTER — Telehealth: Payer: Self-pay | Admitting: Family Medicine

## 2023-08-26 ENCOUNTER — Encounter: Payer: Self-pay | Admitting: Family Medicine

## 2023-08-26 VITALS — BP 120/82 | HR 72 | Temp 97.2°F | Ht 67.0 in | Wt 176.8 lb

## 2023-08-26 DIAGNOSIS — R1013 Epigastric pain: Secondary | ICD-10-CM

## 2023-08-26 NOTE — Patient Instructions (Signed)
 It was very nice to see you today!  We will check for H. pylori.  It is possible he may have a gallstone.  We may need to get an ultrasound depending on the results of your breath test.  Let us  know if you have any worsening symptoms.  Return if symptoms worsen or fail to improve.   Take care, Dr Kennyth  PLEASE NOTE:  If you had any lab tests, please let us  know if you have not heard back within a few days. You may see your results on mychart before we have a chance to review them but we will give you a call once they are reviewed by us .   If we ordered any referrals today, please let us  know if you have not heard from their office within the next week.   If you had any urgent prescriptions sent in today, please check with the pharmacy within an hour of our visit to make sure the prescription was transmitted appropriately.   Please try these tips to maintain a healthy lifestyle:  Eat at least 3 REAL meals and 1-2 snacks per day.  Aim for no more than 5 hours between eating.  If you eat breakfast, please do so within one hour of getting up.   Each meal should contain half fruits/vegetables, one quarter protein, and one quarter carbs (no bigger than a computer mouse)  Cut down on sweet beverages. This includes juice, soda, and sweet tea.   Drink at least 1 glass of water with each meal and aim for at least 8 glasses per day  Exercise at least 150 minutes every week.

## 2023-08-26 NOTE — Telephone Encounter (Signed)
 H Pylori needs to be re-ordered due to Lab error

## 2023-08-26 NOTE — Progress Notes (Signed)
   Wyatt Joseph is a 58 y.o. male who presents today for an office visit.  Assessment/Plan:  Epigastric abdominal pain Concern for biliary colic based on history however patient would like to be screened for H. pylori due to several close for members that have been diagnosed with this recently.  We will order H. pylori breath test today and treat if positive.  If negative will order right upper quadrant ultrasound.  Is okay for him to continue using over-the-counter meds as needed.  We did discuss warning signs and reasons to return to care.  Also advised patient to try taking a food diary to see if he can did not find any potential triggers for his symptoms.     Subjective:  HPI:  See Assessment / plan for status of chronic conditions. Patient here today with abdominal pain. This has been going for for awhile but seems to be getting worse within the last couple of weeks. Symptoms typically last for about an hour then go away. He has noticed symptoms typically are triggered during or after eating.  No nausea or vomiting. No constipation or diarrhea. No blood in stool.  He does have history of GERD but has not had to use medication for this much recently.         Objective:  Physical Exam: BP 120/82   Pulse 72   Temp (!) 97.2 F (36.2 C) (Temporal)   Ht 5' 7 (1.702 m)   Wt 176 lb 12.8 oz (80.2 kg)   SpO2 98%   BMI 27.69 kg/m   Gen: No acute distress, resting comfortably CV: Regular rate and rhythm with no murmurs appreciated Pulm: Normal work of breathing, clear to auscultation bilaterally with no crackles, wheezes, or rhonchi Abdomen: Bowel sounds present, soft, nontender, nondistended.  Murphy sign negative.  No rebound or guarding Neuro: Grossly normal, moves all extremities Psych: Normal affect and thought content      Karris Deangelo M. Kennyth, MD 08/26/2023 12:47 PM

## 2023-08-26 NOTE — Telephone Encounter (Signed)
 Future lab order  Unable to contact patient, incorrect number

## 2023-08-26 NOTE — Addendum Note (Signed)
 Addended by: IDA ELORA HERO on: 08/26/2023 02:12 PM   Modules accepted: Orders

## 2023-09-01 ENCOUNTER — Other Ambulatory Visit

## 2023-09-02 ENCOUNTER — Ambulatory Visit: Payer: Self-pay

## 2023-09-02 LAB — H. PYLORI BREATH TEST: H. pylori Breath Test: DETECTED — AB

## 2023-09-02 NOTE — Telephone Encounter (Signed)
 FYI Only or Action Required?: Action required by provider: clinical question for provider.  Patient was last seen in primary care on 08/26/2023 by Kennyth Worth HERO, MD.  Called Nurse Triage reporting Advice Only.  Symptoms began today.  Interventions attempted: Other: na.  Symptoms are: unchanged.  Triage Disposition: Information or Advice Only Call  Patient/caregiver understands and will follow disposition?: Yes    Copied from CRM [id number]. Topic: Clinical - Lab/Test Results >> Sep 02, 2023  4:26 PM Wess RAMAN wrote: Reason for CRM: Patient has further questions about lab results Reason for Disposition  [1] Follow-up call to recent contact AND [2] information only call, no triage required    Routing encounter to PCP  Answer Assessment - Initial Assessment Questions 1. REASON FOR CALL: What is the main reason for your call? or How can I best help you?     Pt called to seek lab results for H. Plyori - needs results prior to going out of town tomorrow.  Explained to pt I could not release the results and I would call to the office to try to get someone to release that information to pt: pt verbalized understanding  Protocols used: Information Only Call - No Triage-A-AH

## 2023-09-03 ENCOUNTER — Ambulatory Visit: Payer: Self-pay | Admitting: Family Medicine

## 2023-09-03 ENCOUNTER — Telehealth: Payer: Self-pay | Admitting: *Deleted

## 2023-09-03 DIAGNOSIS — K219 Gastro-esophageal reflux disease without esophagitis: Secondary | ICD-10-CM

## 2023-09-03 MED ORDER — PANTOPRAZOLE SODIUM 40 MG PO TBEC: 40.0000 mg | DELAYED_RELEASE_TABLET | Freq: Two times a day (BID) | ORAL | 0 refills | Status: AC

## 2023-09-03 MED ORDER — TETRACYCLINE HCL 500 MG PO TABS: 500.0000 mg | ORAL_TABLET | Freq: Four times a day (QID) | ORAL | 0 refills | Status: AC

## 2023-09-03 MED ORDER — METRONIDAZOLE 500 MG PO TABS: 500.0000 mg | ORAL_TABLET | Freq: Four times a day (QID) | ORAL | 0 refills | Status: AC

## 2023-09-03 NOTE — Progress Notes (Signed)
 His H. pylori breath test is positive.  We need to treat this with quadruple therapy to give him the best chance of success.  Please send in a prescription for Protonix  40 mg twice daily for 14 days, tetracycline  500 mg 4 times daily for 14 days, metronidazole  500 mg 4 times daily for 14 days.    He should take this with 300 mg bismuth subsalicylate 4 times daily for 14 days. This is sold over the counter as Pepto bismol.   It is recommend that he be checked again 4 weeks after completing above regimen to make sure the infection has been cleared.

## 2023-09-03 NOTE — Telephone Encounter (Signed)
 Please see result note.  Worth HERO. Kennyth, MD 09/03/2023 8:03 AM

## 2023-09-03 NOTE — Telephone Encounter (Signed)
 Copied from CRM [id number]. Topic: Clinical - Prescription Issue >> Sep 03, 2023 12:38 PM Taleah C wrote: Reason for CRM: pt called in because he was told he would have a prescription sent in today but it hasn't been sent. He mentioned that he will be traveling this afternoon. Please advise.   Rx send into [facility]  [facility]

## 2024-08-03 ENCOUNTER — Encounter: Admitting: Family Medicine
